# Patient Record
Sex: Female | Born: 1950
Health system: Southern US, Community
[De-identification: ages and names within clinical notes are randomized; demographics above are authoritative.]

## PROBLEM LIST (undated history)

## (undated) DIAGNOSIS — Z8639 Personal history of other endocrine, nutritional and metabolic disease: Secondary | ICD-10-CM

## (undated) DIAGNOSIS — K635 Polyp of colon: Secondary | ICD-10-CM

## (undated) DIAGNOSIS — E669 Obesity, unspecified: Secondary | ICD-10-CM

## (undated) DIAGNOSIS — F329 Major depressive disorder, single episode, unspecified: Secondary | ICD-10-CM

## (undated) DIAGNOSIS — I10 Essential (primary) hypertension: Secondary | ICD-10-CM

## (undated) DIAGNOSIS — F32A Depression, unspecified: Secondary | ICD-10-CM

## (undated) DIAGNOSIS — F419 Anxiety disorder, unspecified: Secondary | ICD-10-CM

## (undated) DIAGNOSIS — J189 Pneumonia, unspecified organism: Secondary | ICD-10-CM

## (undated) DIAGNOSIS — H269 Unspecified cataract: Secondary | ICD-10-CM

## (undated) DIAGNOSIS — E7800 Pure hypercholesterolemia, unspecified: Secondary | ICD-10-CM

## (undated) HISTORY — DX: Pneumonia, unspecified organism: J18.9

## (undated) HISTORY — DX: Unspecified cataract: H26.9

## (undated) HISTORY — PX: OTHER SURGICAL HISTORY: SHX169

## (undated) HISTORY — DX: Depression, unspecified: F32.A

## (undated) HISTORY — DX: Polyp of colon: K63.5

## (undated) HISTORY — DX: Anxiety disorder, unspecified: F41.9

## (undated) HISTORY — DX: Pure hypercholesterolemia, unspecified: E78.00

## (undated) HISTORY — DX: Personal history of other endocrine, nutritional and metabolic disease: Z86.39

## (undated) HISTORY — PX: EYE SURGERY: SHX253

## (undated) HISTORY — DX: Essential (primary) hypertension: I10

## (undated) HISTORY — DX: Major depressive disorder, single episode, unspecified: F32.9

## (undated) HISTORY — DX: Obesity, unspecified: E66.9

---

## 2001-03-13 ENCOUNTER — Emergency Department (HOSPITAL_COMMUNITY): Admission: EM | Admit: 2001-03-13 | Discharge: 2001-03-13 | Payer: Self-pay | Admitting: *Deleted

## 2001-03-13 ENCOUNTER — Encounter: Payer: Self-pay | Admitting: *Deleted

## 2001-09-19 ENCOUNTER — Encounter: Admission: RE | Admit: 2001-09-19 | Discharge: 2001-09-28 | Payer: Self-pay | Admitting: Orthopedic Surgery

## 2003-12-28 ENCOUNTER — Emergency Department (HOSPITAL_COMMUNITY): Admission: EM | Admit: 2003-12-28 | Discharge: 2003-12-28 | Payer: Self-pay | Admitting: Emergency Medicine

## 2004-01-17 ENCOUNTER — Emergency Department (HOSPITAL_COMMUNITY): Admission: EM | Admit: 2004-01-17 | Discharge: 2004-01-18 | Payer: Self-pay | Admitting: Emergency Medicine

## 2004-02-06 ENCOUNTER — Encounter: Admission: RE | Admit: 2004-02-06 | Discharge: 2004-02-06 | Payer: Self-pay | Admitting: Internal Medicine

## 2005-08-09 ENCOUNTER — Ambulatory Visit: Payer: Self-pay | Admitting: Internal Medicine

## 2005-08-10 HISTORY — PX: COLONOSCOPY: SHX174

## 2005-08-24 ENCOUNTER — Encounter (INDEPENDENT_AMBULATORY_CARE_PROVIDER_SITE_OTHER): Payer: Self-pay | Admitting: Specialist

## 2005-08-24 ENCOUNTER — Ambulatory Visit: Payer: Self-pay | Admitting: Internal Medicine

## 2006-01-10 IMAGING — CR DG CHEST 2V
2 series · 2 of 2 positions shown · non-contrast
Comparison: None.

CLINICAL DATA: Fever, chest pain, shortness of breath, and cough.
 TWO-VIEW CHEST, 12/28/03

[view not recorded (1 of 2)]
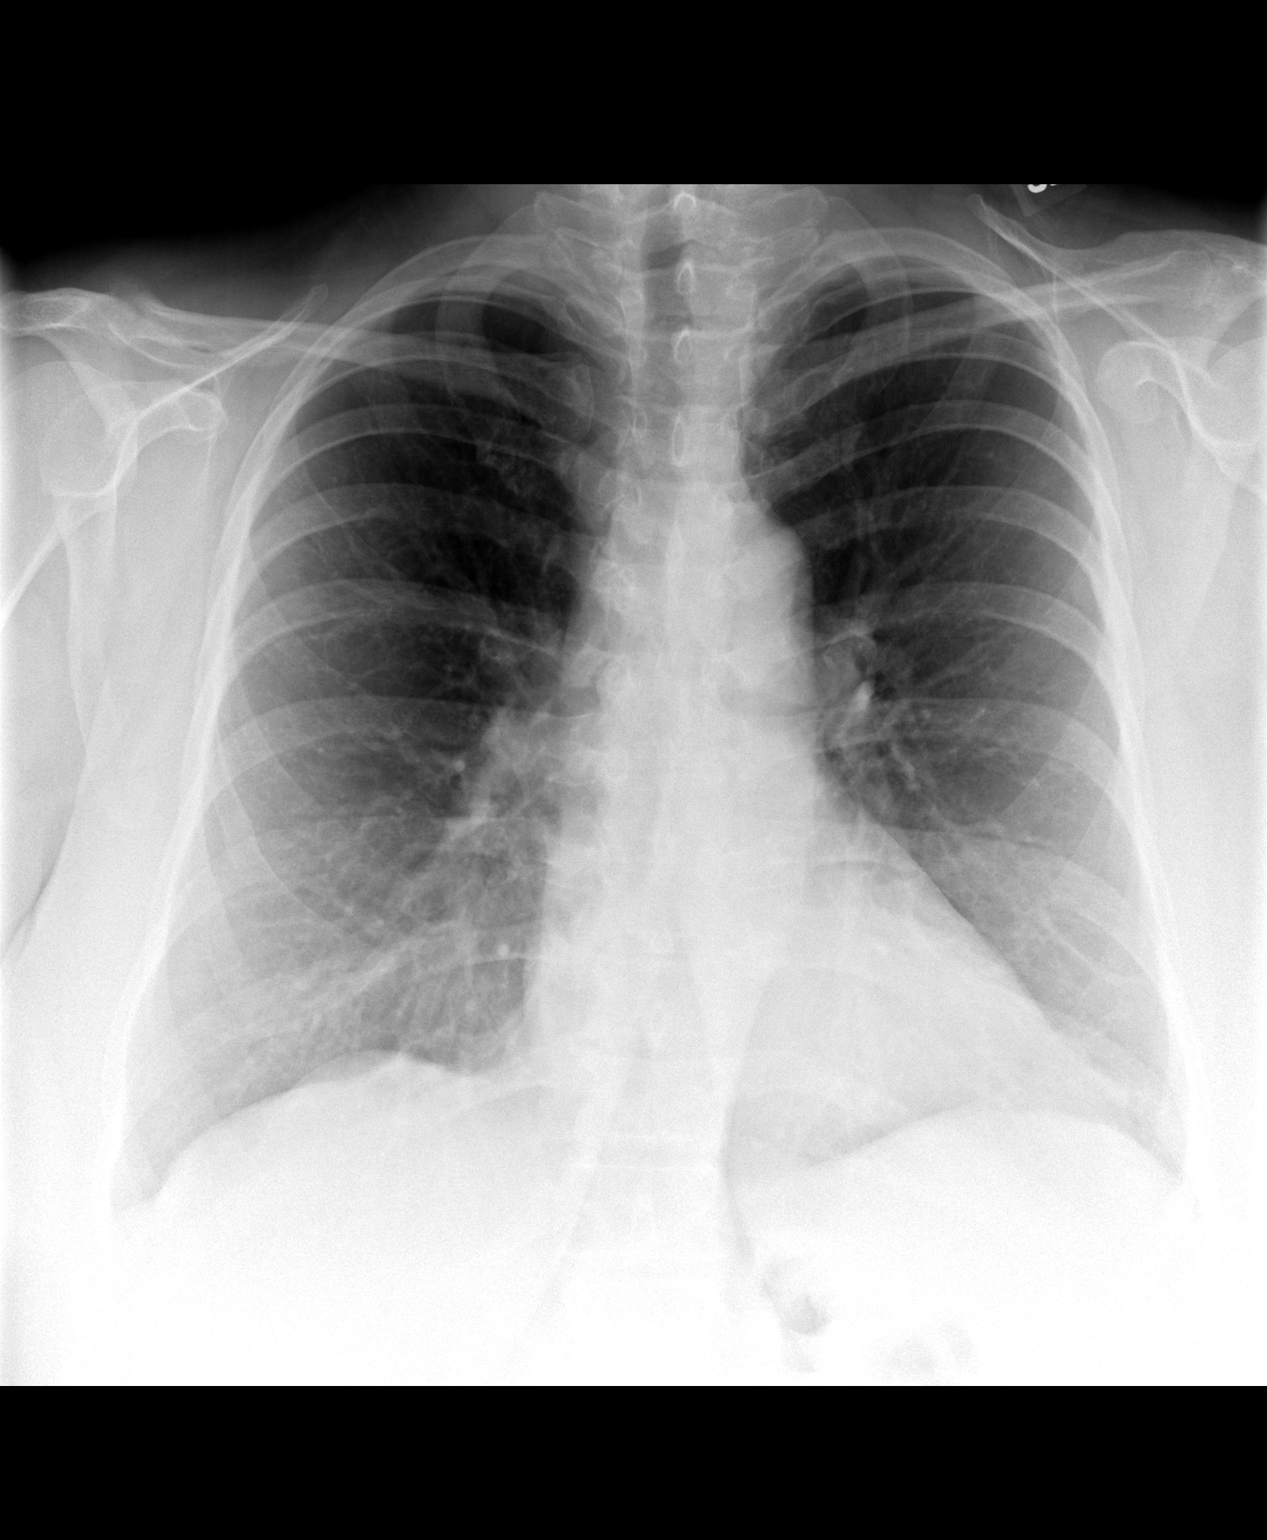

[view not recorded (2 of 2)]
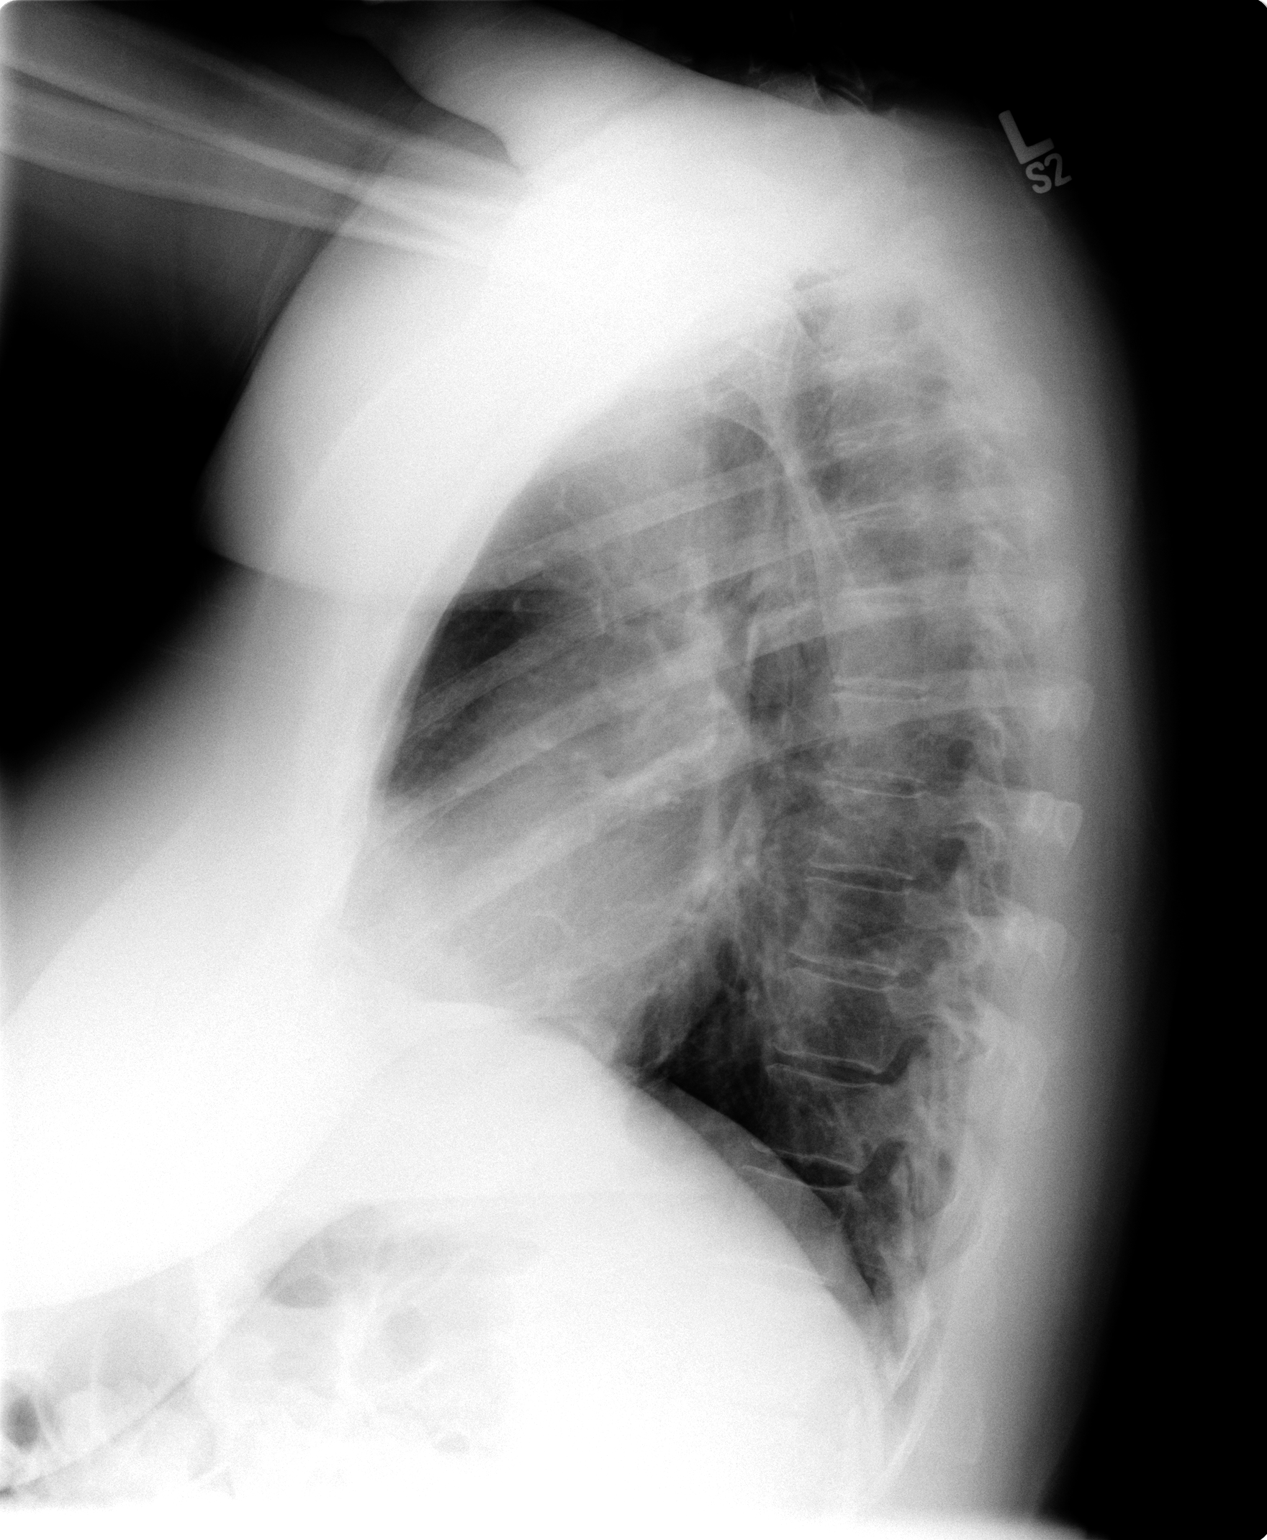

[2 of 2 positions shown; findings below may reference images not displayed]

FINDINGS: Two-view exam of the chest shows focus of atelectasis or infiltrate at the cardiac apex.  The right lung is clear.  The heart size is within normal limits.  Imaged bony structures are intact.  
 IMPRESSION 
 Atelectasis or infiltrate at the left base.  Follow-up imaging to resolution is recommended.

## 2007-01-16 ENCOUNTER — Ambulatory Visit: Payer: Self-pay | Admitting: Internal Medicine

## 2007-01-17 ENCOUNTER — Ambulatory Visit: Payer: Self-pay | Admitting: *Deleted

## 2007-01-24 ENCOUNTER — Ambulatory Visit (HOSPITAL_COMMUNITY): Admission: RE | Admit: 2007-01-24 | Discharge: 2007-01-24 | Payer: Self-pay | Admitting: Family Medicine

## 2007-02-20 ENCOUNTER — Ambulatory Visit: Payer: Self-pay | Admitting: Internal Medicine

## 2007-02-20 ENCOUNTER — Encounter: Payer: Self-pay | Admitting: Internal Medicine

## 2007-02-20 LAB — CONVERTED CEMR LAB
ALT: 21 units/L (ref 0–35)
Albumin: 4.6 g/dL (ref 3.5–5.2)
CO2: 28 meq/L (ref 19–32)
Calcium: 9.1 mg/dL (ref 8.4–10.5)
Cholesterol: 216 mg/dL — ABNORMAL HIGH (ref 0–200)
Eosinophils Relative: 2 % (ref 0–5)
HCT: 42.1 % (ref 36.0–46.0)
Hemoglobin: 13.1 g/dL (ref 12.0–15.0)
MCHC: 31.1 g/dL (ref 30.0–36.0)
MCV: 89 fL (ref 78.0–100.0)
Monocytes Relative: 7 % (ref 3–12)
Neutro Abs: 5 10*3/uL (ref 1.7–7.7)
Platelets: 230 10*3/uL (ref 150–400)
RBC: 4.73 M/uL (ref 3.87–5.11)
RDW: 13.6 % (ref 11.5–15.5)
Sodium: 145 meq/L (ref 135–145)
Total Bilirubin: 0.4 mg/dL (ref 0.3–1.2)
Total CHOL/HDL Ratio: 5.3
Total Protein: 7.4 g/dL (ref 6.0–8.3)
Triglycerides: 145 mg/dL (ref <150)
VLDL: 29 mg/dL (ref 0–40)

## 2007-02-21 ENCOUNTER — Encounter (INDEPENDENT_AMBULATORY_CARE_PROVIDER_SITE_OTHER): Payer: Self-pay | Admitting: Internal Medicine

## 2007-05-18 ENCOUNTER — Ambulatory Visit: Payer: Self-pay | Admitting: Internal Medicine

## 2008-04-30 ENCOUNTER — Ambulatory Visit: Payer: Self-pay | Admitting: Internal Medicine

## 2008-05-06 ENCOUNTER — Ambulatory Visit (HOSPITAL_COMMUNITY): Admission: RE | Admit: 2008-05-06 | Discharge: 2008-05-06 | Payer: Self-pay | Admitting: Internal Medicine

## 2008-05-27 ENCOUNTER — Emergency Department (HOSPITAL_COMMUNITY): Admission: EM | Admit: 2008-05-27 | Discharge: 2008-05-27 | Payer: Self-pay | Admitting: Emergency Medicine

## 2008-05-28 ENCOUNTER — Ambulatory Visit: Payer: Self-pay | Admitting: Family Medicine

## 2008-06-11 ENCOUNTER — Ambulatory Visit: Payer: Self-pay | Admitting: Internal Medicine

## 2008-06-13 ENCOUNTER — Ambulatory Visit: Payer: Self-pay | Admitting: Internal Medicine

## 2008-06-17 ENCOUNTER — Ambulatory Visit (HOSPITAL_COMMUNITY): Admission: RE | Admit: 2008-06-17 | Discharge: 2008-06-17 | Payer: Self-pay | Admitting: Internal Medicine

## 2008-06-25 ENCOUNTER — Ambulatory Visit: Payer: Self-pay | Admitting: Family Medicine

## 2008-07-19 ENCOUNTER — Emergency Department (HOSPITAL_COMMUNITY): Admission: EM | Admit: 2008-07-19 | Discharge: 2008-07-20 | Payer: Self-pay | Admitting: Emergency Medicine

## 2008-07-22 ENCOUNTER — Ambulatory Visit: Payer: Self-pay | Admitting: Internal Medicine

## 2008-07-22 ENCOUNTER — Encounter: Payer: Self-pay | Admitting: Internal Medicine

## 2008-07-22 DIAGNOSIS — I10 Essential (primary) hypertension: Secondary | ICD-10-CM | POA: Insufficient documentation

## 2008-07-22 DIAGNOSIS — F329 Major depressive disorder, single episode, unspecified: Secondary | ICD-10-CM | POA: Insufficient documentation

## 2008-07-22 LAB — CONVERTED CEMR LAB
BUN: 9 mg/dL (ref 6–23)
CO2: 22 meq/L (ref 19–32)
Calcium: 8.7 mg/dL (ref 8.4–10.5)
Chloride: 106 meq/L (ref 96–112)
Creatinine, Ser: 0.8 mg/dL (ref 0.40–1.20)
Glucose, Bld: 101 mg/dL — ABNORMAL HIGH (ref 70–99)
Potassium: 4.4 meq/L (ref 3.5–5.3)
Sodium: 143 meq/L (ref 135–145)

## 2008-07-25 ENCOUNTER — Emergency Department (HOSPITAL_COMMUNITY): Admission: EM | Admit: 2008-07-25 | Discharge: 2008-07-25 | Payer: Self-pay | Admitting: Emergency Medicine

## 2008-08-02 ENCOUNTER — Emergency Department (HOSPITAL_COMMUNITY): Admission: EM | Admit: 2008-08-02 | Discharge: 2008-08-02 | Payer: Self-pay | Admitting: Family Medicine

## 2008-08-15 ENCOUNTER — Ambulatory Visit: Payer: Self-pay | Admitting: Internal Medicine

## 2008-10-19 ENCOUNTER — Emergency Department (HOSPITAL_COMMUNITY): Admission: EM | Admit: 2008-10-19 | Discharge: 2008-10-19 | Payer: Self-pay | Admitting: Emergency Medicine

## 2008-11-08 ENCOUNTER — Ambulatory Visit: Payer: Self-pay | Admitting: Internal Medicine

## 2008-12-24 ENCOUNTER — Ambulatory Visit: Payer: Self-pay | Admitting: Internal Medicine

## 2009-01-07 ENCOUNTER — Ambulatory Visit: Payer: Self-pay | Admitting: Internal Medicine

## 2009-02-11 ENCOUNTER — Telehealth (INDEPENDENT_AMBULATORY_CARE_PROVIDER_SITE_OTHER): Payer: Self-pay | Admitting: *Deleted

## 2009-02-12 ENCOUNTER — Encounter (INDEPENDENT_AMBULATORY_CARE_PROVIDER_SITE_OTHER): Payer: Self-pay | Admitting: Adult Health

## 2009-02-12 ENCOUNTER — Ambulatory Visit: Payer: Self-pay | Admitting: Internal Medicine

## 2009-02-15 ENCOUNTER — Emergency Department (HOSPITAL_COMMUNITY): Admission: EM | Admit: 2009-02-15 | Discharge: 2009-02-15 | Payer: Self-pay | Admitting: Emergency Medicine

## 2009-05-02 ENCOUNTER — Telehealth (INDEPENDENT_AMBULATORY_CARE_PROVIDER_SITE_OTHER): Payer: Self-pay | Admitting: *Deleted

## 2009-07-08 ENCOUNTER — Ambulatory Visit: Payer: Self-pay | Admitting: Internal Medicine

## 2009-07-28 ENCOUNTER — Ambulatory Visit (HOSPITAL_COMMUNITY): Admission: RE | Admit: 2009-07-28 | Discharge: 2009-07-28 | Payer: Self-pay | Admitting: Internal Medicine

## 2009-08-28 ENCOUNTER — Telehealth (INDEPENDENT_AMBULATORY_CARE_PROVIDER_SITE_OTHER): Payer: Self-pay | Admitting: *Deleted

## 2009-08-30 ENCOUNTER — Emergency Department (HOSPITAL_COMMUNITY): Admission: EM | Admit: 2009-08-30 | Discharge: 2009-08-30 | Payer: Self-pay | Admitting: Family Medicine

## 2009-10-01 ENCOUNTER — Ambulatory Visit: Payer: Self-pay | Admitting: Internal Medicine

## 2009-10-01 LAB — CONVERTED CEMR LAB
Calcium: 9.2 mg/dL (ref 8.4–10.5)
Glucose, Bld: 91 mg/dL (ref 70–99)
LDL Cholesterol: 118 mg/dL — ABNORMAL HIGH (ref 0–99)
Potassium: 4.8 meq/L (ref 3.5–5.3)
Sodium: 143 meq/L (ref 135–145)
Total CHOL/HDL Ratio: 5.3
VLDL: 31 mg/dL (ref 0–40)

## 2009-10-16 ENCOUNTER — Ambulatory Visit (HOSPITAL_COMMUNITY): Admission: RE | Admit: 2009-10-16 | Discharge: 2009-10-16 | Payer: Self-pay | Admitting: Internal Medicine

## 2009-10-16 ENCOUNTER — Ambulatory Visit: Payer: Self-pay | Admitting: Family Medicine

## 2009-12-04 ENCOUNTER — Ambulatory Visit: Payer: Self-pay | Admitting: Internal Medicine

## 2010-02-16 ENCOUNTER — Ambulatory Visit: Payer: Self-pay | Admitting: Internal Medicine

## 2010-05-12 NOTE — Progress Notes (Signed)
Summary: triage/possible flu  Phone Note Call from Patient   Caller: Patient Reason for Call: Talk to Nurse Summary of Call: Patient called stating she has a fever of 100.7.Marland KitchenMarland KitchenMarland KitchenIt started with a headache on Tuesday and then a cough on Wednesday.Marland KitchenMarland KitchenShe describes her cough as a dry cough.Marland KitchenMarland KitchenShe is concerned because she has had pneumonia three times  in the past with the last time being 2 years ago.Marland KitchenMarland KitchenPeople at her work have been sick with the same s/s.Marland KitchenMarland KitchenMarland KitchenShe feels tired and weak but denies sore throat, difficulty breathing, chest tightness or chest pain.Marland KitchenMarland KitchenMarland KitchenShe has a hx of HTN...and will get OTC med from pharmacy for cough - and will ask pharmacist what is okay with her htn... Advise patient to stay home from work if possible.Marland KitchenMarland KitchenIt sounds like she has a virus possibly the flu...and should drink plenty of liquids and get rest..Marland KitchenShe will call me Monday morning to let me know how she is doing.Marland KitchenMarland KitchenShe is aware she can go to ED this weekend if s/s become severe..  Initial call taken by: Conchita Paris,  May 02, 2009 10:30 AM

## 2010-05-12 NOTE — Progress Notes (Signed)
Summary: triage/head congestion  Phone Note Call from Patient   Caller: Patient Reason for Call: Talk to Nurse Summary of Call: patient states she has had a head cold for one week with a dry cough. She states she does not have a fever and is calling to see what she can get OTC...she has a history of pneumonia but feels the congestion is just in her head...she denies any hx of asthma or chronic health conditions. Advised patient to try OTC mucinex...she can take a list of her medications with her to pharmacy to see if anything else or any Mucinex-D would be appropriate..speak with pharmacists. Patient states understanding and will call back if no improvement or worsening in s/s.  We also discussed weight control..since patient broke up that a few days ago her knee was hurting...seems okay now...she is 5'3" and weighs 200 lbs...we discussed portion control, fresh fruits and vegetables.Marland Kitchenat least 64oz of water daily...walking exercising...checking out [employer].Marland Kitchen scholarships.Marland KitchenMarland KitchenPatient states understanding. Initial call taken by: Conchita Paris,  Aug 28, 2009 11:33 AM

## 2010-07-15 LAB — URINE MICROSCOPIC-ADD ON

## 2010-07-15 LAB — URINE CULTURE: Colony Count: 35000

## 2010-07-15 LAB — URINALYSIS, ROUTINE W REFLEX MICROSCOPIC
Glucose, UA: NEGATIVE mg/dL
Protein, ur: NEGATIVE mg/dL
Specific Gravity, Urine: 1.011 (ref 1.005–1.030)

## 2010-07-19 LAB — URINALYSIS, ROUTINE W REFLEX MICROSCOPIC
Bilirubin Urine: NEGATIVE
Hgb urine dipstick: NEGATIVE
Nitrite: NEGATIVE
Protein, ur: NEGATIVE mg/dL
pH: 7 (ref 5.0–8.0)

## 2010-07-19 LAB — POCT I-STAT, CHEM 8
BUN: 8 mg/dL (ref 6–23)
Chloride: 106 mEq/L (ref 96–112)
HCT: 35 % — ABNORMAL LOW (ref 36.0–46.0)
Potassium: 3.8 mEq/L (ref 3.5–5.1)
TCO2: 26 mmol/L (ref 0–100)

## 2010-07-19 LAB — POCT CARDIAC MARKERS
CKMB, poc: <1 ng/mL — ABNORMAL LOW (ref 1.0–8.0)
Myoglobin, poc: 81.1 ng/mL (ref 12–200)
Troponin i, poc: <0.05 ng/mL (ref 0.00–0.09)

## 2010-07-22 LAB — URINALYSIS, ROUTINE W REFLEX MICROSCOPIC
Bilirubin Urine: NEGATIVE
Glucose, UA: NEGATIVE mg/dL
Hgb urine dipstick: NEGATIVE
Hgb urine dipstick: NEGATIVE
Ketones, ur: NEGATIVE mg/dL
Nitrite: NEGATIVE
Protein, ur: NEGATIVE mg/dL
Specific Gravity, Urine: 1.005 (ref 1.005–1.030)
Urobilinogen, UA: 0.2 mg/dL (ref 0.0–1.0)
Urobilinogen, UA: 0.2 mg/dL (ref 0.0–1.0)
pH: 7 (ref 5.0–8.0)

## 2010-07-22 LAB — COMPREHENSIVE METABOLIC PANEL
AST: 29 U/L (ref 0–37)
Alkaline Phosphatase: 87 U/L (ref 39–117)
BUN: 9 mg/dL (ref 6–23)
CO2: 26 mEq/L (ref 19–32)
CO2: 29 mEq/L (ref 19–32)
Calcium: 8.8 mg/dL (ref 8.4–10.5)
Creatinine, Ser: 0.77 mg/dL (ref 0.4–1.2)
Creatinine, Ser: 0.8 mg/dL (ref 0.4–1.2)
GFR calc Af Amer: >60 mL/min (ref >60)
GFR calc non Af Amer: >60 mL/min (ref >60)
Glucose, Bld: 103 mg/dL — ABNORMAL HIGH (ref 70–99)
Glucose, Bld: 105 mg/dL — ABNORMAL HIGH (ref 70–99)
Sodium: 128 mEq/L — ABNORMAL LOW (ref 135–145)
Sodium: 141 mEq/L (ref 135–145)
Total Bilirubin: 0.6 mg/dL (ref 0.3–1.2)
Total Protein: 6.8 g/dL (ref 6.0–8.3)

## 2010-07-22 LAB — URINE MICROSCOPIC-ADD ON

## 2010-07-22 LAB — DIFFERENTIAL
Basophils Absolute: 0.1 10*3/uL (ref 0.0–0.1)
Eosinophils Absolute: 0.1 10*3/uL (ref 0.0–0.7)
Eosinophils Relative: 1 % (ref 0–5)
Eosinophils Relative: 1 % (ref 0–5)
Lymphocytes Relative: 27 % (ref 12–46)
Monocytes Absolute: 0.3 10*3/uL (ref 0.1–1.0)
Monocytes Absolute: 0.6 10*3/uL (ref 0.1–1.0)
Neutro Abs: 5.8 10*3/uL (ref 1.7–7.7)
Neutro Abs: 6.3 10*3/uL (ref 1.7–7.7)
Neutrophils Relative %: 64 % (ref 43–77)
Smear Review: ADEQUATE

## 2010-07-22 LAB — CBC
MCV: 86.1 fL (ref 78.0–100.0)
MCV: 86.6 fL (ref 78.0–100.0)
RBC: 4.12 MIL/uL (ref 3.87–5.11)
RBC: 4.38 MIL/uL (ref 3.87–5.11)

## 2010-07-28 LAB — CK TOTAL AND CKMB (NOT AT ARMC)
Relative Index: INVALID (ref 0.0–2.5)
Total CK: 91 U/L (ref 7–177)

## 2010-07-28 LAB — CBC
MCHC: 34.5 g/dL (ref 30.0–36.0)
Platelets: 206 10*3/uL (ref 150–400)
RBC: 4.74 MIL/uL (ref 3.87–5.11)
RDW: 14.2 % (ref 11.5–15.5)

## 2010-07-28 LAB — BASIC METABOLIC PANEL
BUN: 10 mg/dL (ref 6–23)
CO2: 26 mEq/L (ref 19–32)
Calcium: 9.2 mg/dL (ref 8.4–10.5)
Creatinine, Ser: 0.82 mg/dL (ref 0.4–1.2)
GFR calc Af Amer: >60 mL/min (ref >60)
Glucose, Bld: 102 mg/dL — ABNORMAL HIGH (ref 70–99)

## 2010-08-26 ENCOUNTER — Other Ambulatory Visit (HOSPITAL_COMMUNITY): Payer: Self-pay | Admitting: Family Medicine

## 2010-08-26 ENCOUNTER — Other Ambulatory Visit: Payer: Self-pay | Admitting: Family Medicine

## 2010-08-26 DIAGNOSIS — Z1231 Encounter for screening mammogram for malignant neoplasm of breast: Secondary | ICD-10-CM

## 2010-09-09 ENCOUNTER — Ambulatory Visit (HOSPITAL_COMMUNITY)
Admission: RE | Admit: 2010-09-09 | Discharge: 2010-09-09 | Disposition: A | Discharge status: Home / Self Care | Payer: Self-pay | Source: Ambulatory Visit | Attending: Family Medicine | Admitting: Family Medicine

## 2010-09-09 DIAGNOSIS — Z1231 Encounter for screening mammogram for malignant neoplasm of breast: Secondary | ICD-10-CM | POA: Insufficient documentation

## 2010-09-29 ENCOUNTER — Encounter: Payer: Self-pay | Admitting: Internal Medicine

## 2012-01-06 ENCOUNTER — Encounter: Payer: Self-pay | Admitting: Internal Medicine

## 2012-05-15 ENCOUNTER — Emergency Department (HOSPITAL_COMMUNITY)
Admission: EM | Admit: 2012-05-15 | Discharge: 2012-05-15 | Disposition: A | Discharge status: Home / Self Care | Payer: No Typology Code available for payment source | Source: Home / Self Care | Attending: Family Medicine | Admitting: Family Medicine

## 2012-05-15 ENCOUNTER — Encounter (HOSPITAL_COMMUNITY): Payer: Self-pay

## 2012-05-15 DIAGNOSIS — R7303 Prediabetes: Secondary | ICD-10-CM

## 2012-05-15 DIAGNOSIS — R7309 Other abnormal glucose: Secondary | ICD-10-CM

## 2012-05-15 DIAGNOSIS — F329 Major depressive disorder, single episode, unspecified: Secondary | ICD-10-CM

## 2012-05-15 DIAGNOSIS — I10 Essential (primary) hypertension: Secondary | ICD-10-CM

## 2012-05-15 LAB — COMPREHENSIVE METABOLIC PANEL
BUN: 9 mg/dL (ref 6–23)
Calcium: 9.4 mg/dL (ref 8.4–10.5)
Creatinine, Ser: 0.78 mg/dL (ref 0.50–1.10)
GFR calc Af Amer: >90 mL/min (ref >90)
Glucose, Bld: 102 mg/dL — ABNORMAL HIGH (ref 70–99)
Total Protein: 7.2 g/dL (ref 6.0–8.3)

## 2012-05-15 LAB — LIPID PANEL
Cholesterol: 239 mg/dL — ABNORMAL HIGH (ref 0–200)
HDL: 45 mg/dL (ref >39)
Total CHOL/HDL Ratio: 5.3 RATIO

## 2012-05-15 MED ORDER — LOSARTAN POTASSIUM 50 MG PO TABS: 50.0000 mg | ORAL_TABLET | Freq: Every day | ORAL | Status: DC

## 2012-05-15 MED ORDER — CITALOPRAM HYDROBROMIDE 20 MG PO TABS: 20.0000 mg | ORAL_TABLET | Freq: Every day | ORAL | Status: DC

## 2012-05-15 NOTE — ED Notes (Signed)
Patient states needs medication refill on bp med.  Also has some paper work from [employer] center she needs filled out

## 2012-05-15 NOTE — ED Provider Notes (Signed)
History     CSN: [id number]  Arrival date & time 05/15/12  1558   First MD Initiated Contact with Patient 05/15/12 1733      Chief Complaint  Patient presents with  . Medication Refill    (Consider location/radiation/quality/duration/timing/severity/associated sxs/prior treatment) HPI Patient presenting to get refills on her medications.  She reports that she has only been taking one half of her blood pressure pill.  She only takes one half of the 100 mg tablet of losartan.  The patient reports that she has had good blood pressure control with this regimen.  She's been doing this for several months.  Her blood pressure is well-controlled today. 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  She has no job.  She reports that her husband lost his job as well.  She otherwise reports that she has no complaints.  She is currently working hard to try and find a job.  History reviewed. No pertinent past medical history.  History reviewed. No pertinent past surgical history.  No family history on file.  History  Substance Use Topics  . Smoking status: Not on file  . Smokeless tobacco: Not on file  . Alcohol Use: Not on file    OB History    Grav Para Term Preterm Abortions TAB SAB Ect Mult Living                 Review of Systems  Constitutional: Negative.   HENT: Positive for congestion.   Eyes: Negative.   Respiratory: Negative.   Cardiovascular: Negative.   Gastrointestinal: Negative.   Musculoskeletal: Negative.   Neurological: Negative.   Hematological: Negative.   Psychiatric/Behavioral: Negative.     Allergies  Belladonna; Phenobarbital; and Quinine  Home Medications   Current Outpatient Rx  Name  Route  Sig  Dispense  Refill  . CITALOPRAM HYDROBROMIDE 20 MG PO TABS   Oral   Take 20 mg by mouth daily. Take one and one half daily         . LOSARTAN POTASSIUM 100 MG PO TABS   Oral   Take 100 mg by mouth daily.           BP 129/53  Pulse  69  Temp 97.9 F (36.6 C) (Oral)  Resp 16  SpO2 99%  Physical Exam  Nursing note and vitals reviewed. Constitutional: She is oriented to person, place, and time. She appears well-developed and well-nourished. No distress.  HENT:  Head: Normocephalic and atraumatic.  Eyes: Conjunctivae normal and EOM are normal. Pupils are equal, round, and reactive to light.  Neck: Normal range of motion. Neck supple. No JVD present. No tracheal deviation present. No thyromegaly present.  Cardiovascular: Normal rate, regular rhythm and normal heart sounds.   Pulmonary/Chest: Effort normal and breath sounds normal.  Abdominal: Soft. Bowel sounds are normal. She exhibits no distension and no mass. There is no tenderness. There is no rebound and no guarding.  Musculoskeletal: Normal range of motion. She exhibits no edema and no tenderness.  Lymphadenopathy:    She has no cervical adenopathy.  Neurological: She is alert and oriented to person, place, and time.  Skin: Skin is warm and dry. No erythema.  Psychiatric: She has a normal mood and affect. Her behavior is normal. Judgment and thought content normal.    ED Course  Procedures (including critical care time)  Labs Reviewed - No data to display No results found.   No diagnosis found.  MDM  IMPRESSION  Hypertension  Prediabetes   RECOMMENDATIONS / PLAN Check labs today Refill medications  FOLLOW UP 3 months  The patient was given clear instructions to go to ER or return to medical center if symptoms don't improve, worsen or new problems develop.  The patient verbalized understanding.  The patient was told to call to get lab results if they haven't heard anything in the next week.            Cleora Fleet, MD 05/15/12 2006

## 2012-05-16 LAB — HEMOGLOBIN A1C: Mean Plasma Glucose: 126 mg/dL — ABNORMAL HIGH (ref <117)

## 2012-05-17 NOTE — Progress Notes (Signed)
Quick Note:  Please notify patient that her blood sugar and A1c is rising. Her A1c is 6.0 now and it was 5.8% 2 years ago. She has prediabetes. Please send her some information on diabetes diet and exercise. Her cholesterol levels are very high. Recommend that she start a low fat low cholesterol diet. She should have her labs rechecked in 3 months.   Rodney Langton, MD, CDE, FAAFP [facility] [place], [place]   ______

## 2012-05-18 ENCOUNTER — Telehealth (HOSPITAL_COMMUNITY): Payer: Self-pay

## 2012-05-18 NOTE — Telephone Encounter (Signed)
Message copied by Lestine Mount on Thu May 18, 2012  9:16 AM ------      Message from: Cleora Fleet      Created: Wed May 17, 2012  8:58 AM       Please notify patient that her blood sugar and A1c is rising.  Her A1c is 6.0 now and it was 5.8% 2 years ago.  She has prediabetes.  Please send her some information on diabetes diet and exercise.  Her cholesterol levels are very high.  Recommend that she start a low fat low cholesterol diet.  She should have her labs rechecked in 3 months.               Rodney Langton, MD, CDE, FAAFP      [facility]      [facility]      [place], [place]

## 2012-09-19 ENCOUNTER — Ambulatory Visit: Payer: No Typology Code available for payment source | Attending: Family Medicine | Admitting: Family Medicine

## 2012-09-19 VITALS — BP 146/42 | HR 69 | Temp 98.2°F | Resp 16 | Ht 62.99 in | Wt 210.0 lb

## 2012-09-19 DIAGNOSIS — E785 Hyperlipidemia, unspecified: Secondary | ICD-10-CM

## 2012-09-19 DIAGNOSIS — R0789 Other chest pain: Secondary | ICD-10-CM | POA: Insufficient documentation

## 2012-09-19 DIAGNOSIS — I10 Essential (primary) hypertension: Secondary | ICD-10-CM

## 2012-09-19 DIAGNOSIS — F329 Major depressive disorder, single episode, unspecified: Secondary | ICD-10-CM

## 2012-09-19 LAB — LIPID PANEL
Cholesterol: 185 mg/dL (ref 0–200)
Total CHOL/HDL Ratio: 4.9 Ratio

## 2012-09-19 LAB — COMPREHENSIVE METABOLIC PANEL
AST: 24 U/L (ref 0–37)
Albumin: 4 g/dL (ref 3.5–5.2)
BUN: 12 mg/dL (ref 6–23)
Calcium: 8.7 mg/dL (ref 8.4–10.5)
Chloride: 105 mEq/L (ref 96–112)
Glucose, Bld: 95 mg/dL (ref 70–99)
Potassium: 4.4 mEq/L (ref 3.5–5.3)
Sodium: 138 mEq/L (ref 135–145)
Total Protein: 6.2 g/dL (ref 6.0–8.3)

## 2012-09-19 LAB — CBC
Hemoglobin: 12.9 g/dL (ref 12.0–15.0)
MCHC: 33.1 g/dL (ref 30.0–36.0)
Platelets: 231 10*3/uL (ref 150–400)

## 2012-09-19 LAB — POCT GLYCOSYLATED HEMOGLOBIN (HGB A1C): Hemoglobin A1C: 5.6

## 2012-09-19 MED ORDER — LOSARTAN POTASSIUM-HCTZ 50-12.5 MG PO TABS: 1.0000 | ORAL_TABLET | Freq: Every day | ORAL | Status: DC

## 2012-09-19 NOTE — Progress Notes (Addendum)
Patient ID: Angelica Harvey, female   DOB: Mar 28, 1951, 62 y.o.   MRN: [id number]  CC: follow up   HPI: Pt says that she has had twinges of palpatations and chest discomfort on occasional.  Pt says that she is taking her medication.  Pt says that she is working to lower her carb intake.  Otherwise pt says that she is well.    Allergies  Allergen Reactions  . Belladonna     REACTION: rash  . Phenobarbital     REACTION: rash  . Quinine     REACTION: rash   No past medical history on file. Current Outpatient Prescriptions on File Prior to Visit  Medication Sig Dispense Refill  . citalopram (CELEXA) 20 MG tablet Take 1 tablet (20 mg total) by mouth daily. Take one and one half daily  30 tablet  3  . losartan (COZAAR) 50 MG tablet Take 1 tablet (50 mg total) by mouth daily.  30 tablet  3   No current facility-administered medications on file prior to visit.   No family history on file. History   Social History  . Marital Status: Married    Spouse Name: N/A    Number of Children: N/A  . Years of Education: N/A   Occupational History  . Not on file.   Social History Main Topics  . Smoking status: Never Smoker   . Smokeless tobacco: Not on file  . Alcohol Use: Not on file  . Drug Use: Not on file  . Sexually Active: Not on file   Other Topics Concern  . Not on file   Social History Narrative  . No narrative on file    Review of Systems  Constitutional: Negative for fever, chills, diaphoresis, activity change, appetite change and fatigue.  HENT: Negative for ear pain, nosebleeds, congestion, facial swelling, rhinorrhea, neck pain, neck stiffness and ear discharge.   Eyes: Negative for pain, discharge, redness, itching and visual disturbance.  Respiratory: Negative for cough, choking, chest tightness, shortness of breath, wheezing and stridor.   Cardiovascular: Negative for chest pain, palpitations and leg swelling.  Gastrointestinal: Negative for abdominal distention.   Genitourinary: Negative for dysuria, urgency, frequency, hematuria, flank pain, decreased urine volume, difficulty urinating and dyspareunia.  Musculoskeletal: Negative for back pain, joint swelling, arthralgias and gait problem.  Neurological: Negative for dizziness, tremors, seizures, syncope, facial asymmetry, speech difficulty, weakness, light-headedness, numbness and headaches.  Hematological: Negative for adenopathy. Does not bruise/bleed easily.  Psychiatric/Behavioral: Negative for hallucinations, behavioral problems, confusion, dysphoric mood, decreased concentration and agitation.    Objective:   Filed Vitals:   09/19/12 1530  BP: 146/42  Pulse: 69  Temp: 98.2 F (36.8 C)  Resp: 16    Physical Exam  Constitutional: Appears well-developed and well-nourished. No distress.  HENT: Normocephalic. External right and left ear normal. Oropharynx is clear and moist.  Eyes: Conjunctivae and EOM are normal. PERRLA, no scleral icterus.  Neck: Normal ROM. Neck supple. No JVD. No tracheal deviation. No thyromegaly.  CVS: RRR, S1/S2 +, no murmurs, no gallops, no carotid bruit.  Pulmonary: Effort and breath sounds normal, no stridor, rhonchi, wheezes, rales.  Abdominal: Soft. BS +,  no distension, tenderness, rebound or guarding.  Musculoskeletal: Normal range of motion. No edema and no tenderness.  Lymphadenopathy: No lymphadenopathy noted, cervical, inguinal. Neuro: Alert. Normal reflexes, muscle tone coordination. No cranial nerve deficit. Skin: Skin is warm and dry. No rash noted. Not diaphoretic. No erythema. No pallor.  Psychiatric: Normal mood and  affect. Behavior, judgment, thought content normal.   Lab Results  Component Value Date   WBC 8.0 07/25/2008   HGB 11.9* 10/19/2008   HCT 35.0* 10/19/2008   MCV 86.1 07/25/2008   PLT 213 07/25/2008   Lab Results  Component Value Date   CREATININE 0.78 05/15/2012   BUN 9 05/15/2012   NA 141 05/15/2012   K 4.2 05/15/2012   CL 105 05/15/2012    CO2 28 05/15/2012    Lab Results  Component Value Date   HGBA1C 6.0* 05/15/2012   Lipid Panel     Component Value Date/Time   CHOL 239* 05/15/2012 1755   TRIG 176* 05/15/2012 1755   HDL 45 05/15/2012 1755   CHOLHDL 5.3 05/15/2012 1755   VLDL 35 05/15/2012 1755   LDLCALC 159* 05/15/2012 1755       Assessment and plan:   Patient Active Problem List   Diagnosis Date Noted  . Dyslipidemia 09/19/2012  . DEPRESSION 07/22/2008  . HYPERTENSION 07/22/2008  . HYPOKALEMIA, HX OF 07/22/2008     Add HCT to the losartan 50/12.5 take 1 po daily Check labs today. Check EKG Gave information to patient about diet and exercise Referral to cardiology for evalu of chest discomfort Pt is up to date on colonoscopy.  Follow up in 3 months  The patient was given clear instructions to go to ER or return to medical center if symptoms don't improve, worsen or new problems develop.  The patient verbalized understanding.  The patient was told to call to get lab results if they haven't heard anything in the next week.    Rodney Langton, MD, CDE, FAAFP [facility] [place], [place]

## 2012-09-19 NOTE — Patient Instructions (Addendum)

## 2012-09-19 NOTE — Progress Notes (Signed)
Pt is here for a 3 month f/u from 02/14 for prediabetes and needing refills on meds Reports she does not need Celexa since [facility] is Rx this med for her She is alert and oriented w/no signs of acute distress.

## 2012-09-19 NOTE — Addendum Note (Signed)
Addended by: Cleora Fleet on: 09/19/2012 04:13 PM   Modules accepted: Orders

## 2012-09-20 ENCOUNTER — Telehealth: Payer: Self-pay

## 2012-09-20 NOTE — Telephone Encounter (Signed)
Patient called stating the losartan-HCTZ she can not take The hydrachlorathiazide she states she cant take because it messes her up She also stated she is one of the 30% who can not take this

## 2012-09-20 NOTE — Progress Notes (Signed)
Quick Note:  Please inform patient that labs came back okay.  C. L. Johnson, MD, CDE, FAAFP [facility] [employer] [place], [place]   ______ 

## 2012-09-21 ENCOUNTER — Telehealth: Payer: Self-pay | Admitting: *Deleted

## 2012-09-21 MED ORDER — LOSARTAN POTASSIUM 50 MG PO TABS: 50.0000 mg | ORAL_TABLET | Freq: Every day | ORAL | Status: DC

## 2012-09-21 NOTE — Telephone Encounter (Signed)
Patient called to the office reporting that she does not tolerate hydrochlorothiazide.  Therefore, I discontinued the Hyzaar and will give patient a new prescription for losartan 50 mg take 1 by mouth daily, #30, refill x5.  Rodney Langton, MD, CDE, FAAFP [facility] [place], [place]

## 2012-09-21 NOTE — Telephone Encounter (Signed)
09/21/12 Patient made aware of lab results WNL per Dr. Laural Benes. Also patient she was in clinic yesterday and received script from Dr.Johnson lasartan/hydrochorothiazide patient stated she can not take  hydrochorothiazide please inform Dr. Laural Benes prefer taking what she currently is taking [facility]

## 2012-09-21 NOTE — Telephone Encounter (Signed)
Please have the patient come to pick up a new prescription for losartan 50 mg take 1 po daily, #30, RFx5.  This one has no hydrochlorothiazide.     Rodney Langton, MD, CDE, FAAFP [facility] [place], [place]

## 2012-09-21 NOTE — Telephone Encounter (Signed)
09/21/12 Message left for patient to pick up script at desk. P.Nicolai Labonte,RN

## 2012-10-05 ENCOUNTER — Telehealth: Payer: Self-pay | Admitting: *Deleted

## 2012-11-17 ENCOUNTER — Institutional Professional Consult (permissible substitution): Payer: Self-pay | Admitting: Cardiology

## 2013-01-11 ENCOUNTER — Ambulatory Visit (INDEPENDENT_AMBULATORY_CARE_PROVIDER_SITE_OTHER): Payer: No Typology Code available for payment source | Admitting: Cardiology

## 2013-01-11 ENCOUNTER — Encounter: Payer: Self-pay | Admitting: Cardiology

## 2013-01-11 VITALS — BP 138/70 | HR 64 | Ht 63.0 in | Wt 215.2 lb

## 2013-01-11 DIAGNOSIS — E785 Hyperlipidemia, unspecified: Secondary | ICD-10-CM

## 2013-01-11 DIAGNOSIS — R079 Chest pain, unspecified: Secondary | ICD-10-CM

## 2013-01-11 DIAGNOSIS — I10 Essential (primary) hypertension: Secondary | ICD-10-CM

## 2013-01-11 NOTE — Patient Instructions (Addendum)
Take Aciphex daily for at least 2 weeks and see if your symptoms improve.  If you continue to have chest pain let me know and we will schedule you for a stress test.

## 2013-01-11 NOTE — Progress Notes (Signed)
Angelica Harvey Date of Birth: 18-Oct-1950 Medical Record #[id number]  History of Present Illness: Angelica Harvey is seen at the request of Dr. Laural Benes for evaluation of chest pain. She is very pleasant 62 year old white female. She has a history of hypertension and hypercholesterolemia. She has a history of obesity. She reports she has been under a lot of stress this year since she lost her job. She states that early in the summer she was experiencing symptoms of chest discomfort sometimes in her left anterior chest and sometimes in the right. She is convinced now that this is more acid reflux. She describes her pain as sharp. She has associated soreness in her throat especially when she is lying down. She has a lot of indigestion symptoms. She has a dry cough. She has no prior history of heart disease. Her symptoms are not exertional.  Current Outpatient Prescriptions on File Prior to Visit  Medication Sig Dispense Refill  . losartan (COZAAR) 50 MG tablet Take 1 tablet (50 mg total) by mouth daily.  30 tablet  5   No current facility-administered medications on file prior to visit.    Allergies  Allergen Reactions  . Belladonna     REACTION: rash  . Hctz [Hydrochlorothiazide]   . Phenobarbital     REACTION: rash  . Quinine     REACTION: rash    Past Medical History  Diagnosis Date  . Dyslipidemia   . Depression   . Hypertension   . History of hypokalemia   . Hypercholesterolemia     Past Surgical History  Procedure Laterality Date  . Cesarean section      History  Smoking status  . Former Smoker -- 1.00 packs/day for 17 years  . Quit date: 04/12/1992  Smokeless tobacco  . Not on file    History  Alcohol Use: Not on file    Family History  Problem Relation Age of Onset  . Heart attack Father   . Heart disease Father     CABG    Review of Systems: As noted in history of present illness.  All other systems were reviewed and are negative.  Physical Exam: BP  138/70  Pulse 64  Ht 5\' 3"  (1.6 m)  Wt 215 lb 4 oz (97.637 kg)  BMI 38.14 kg/m2 She is a pleasant, morbidly obese we female in no acute distress. HEENT: Normal. Neck: No adenopathy, JVD, bruits, or thyromegaly. Lungs: Clear Cardiovascular: Regular rate and rhythm. Normal S1 and S2 without gallop, murmur, or click. Abdomen: Obese, soft, nontender. Bowel sounds are positive. No masses or bruits. Extremities: Femoral and pedal pulses are 2+. She has no edema. Skin: Warm and dry Neuro: Alert oriented x3. Cranial nerves II through XII are intact.  LABORATORY DATA: Lab Results  Component Value Date   WBC 10.1 09/19/2012   HGB 12.9 09/19/2012   HCT 39.0 09/19/2012   PLT 231 09/19/2012   GLUCOSE 95 09/19/2012   CHOL 185 09/19/2012   TRIG 143 09/19/2012   HDL 38* 09/19/2012   LDLCALC 118* 09/19/2012   ALT 25 09/19/2012   AST 24 09/19/2012   NA 138 09/19/2012   K 4.4 09/19/2012   CL 105 09/19/2012   CREATININE 0.82 09/19/2012   BUN 12 09/19/2012   CO2 26 09/19/2012   TSH 1.655 05/15/2012   HGBA1C 5.6 % 09/19/2012    ECG on 09/19/2012 was reviewed. This is a normal study.  Assessment / Plan: 1. Atypical chest pain. Clinically  her symptoms are much more consistent with acid reflux disease. I suspect her dry cough may also be related to reflux. Other potential etiologies include coronary disease or gallstone disease. She does have cardiac factors of hypertension, hypercholesterolemia, and family history of heart disease.  2. Hypercholesterolemia  3. Hypertension-controlled.  Plan: We discussed potential evaluation and treatment. The patient states that her husband has a lot of AcipHex from prior prescription. I think it would be reasonable to give her a trial of antireflux therapy to see how her symptoms respond. If she has a good response to therapy then I don't think any further evaluation is warranted. If her symptoms do not improve with therapy I would recommend a stress test. She currently has no  insurance and no job so would like to avoid expensive tests if possible. She is going to call if her symptoms do not resolve. I recommend continuing her antihypertensive therapy. She needs to follow a heart healthy diet with focus on weight loss.

## 2013-01-24 ENCOUNTER — Ambulatory Visit
Admission: RE | Admit: 2013-01-24 | Discharge: 2013-01-24 | Disposition: A | Discharge status: Home / Self Care | Payer: No Typology Code available for payment source | Source: Ambulatory Visit | Attending: Internal Medicine | Admitting: Internal Medicine

## 2013-01-24 ENCOUNTER — Ambulatory Visit: Payer: No Typology Code available for payment source | Attending: Internal Medicine | Admitting: Internal Medicine

## 2013-01-24 VITALS — BP 153/80 | HR 75 | Temp 98.3°F | Resp 16 | Wt 214.0 lb

## 2013-01-24 DIAGNOSIS — M25519 Pain in unspecified shoulder: Secondary | ICD-10-CM

## 2013-01-24 DIAGNOSIS — M25511 Pain in right shoulder: Secondary | ICD-10-CM

## 2013-01-24 MED ORDER — LOSARTAN POTASSIUM 50 MG PO TABS: 50.0000 mg | ORAL_TABLET | Freq: Every day | ORAL | Status: DC

## 2013-01-24 NOTE — Progress Notes (Signed)
Pt is here for a f/u from last visit C/o bilateral shoulder pain... Reports she tripped and fell onto carpet flooring Pain increases w/activity... Ambulated well to exam room w/NAD Alert w/no signs of acute distress.

## 2013-01-24 NOTE — Progress Notes (Signed)
Patient ID: Angelica Harvey, female   DOB: 1950/11/26, 62 y.o.   MRN: [id number]   chief complaint Bilateral shoulder joint pains  History of present illness 62 year old female with history of hypertension, depression, acid reflux here for pain over bilateral shoulders for  2 weeks. Patient reports falling at home on an outstretched hand while trying to the bathroom in the night. Since then she has been over bilateral shoulders right greater than left with painful and limited range of motion. She denies any fever, chills, headache, blurred vision, dizziness, chest pain, palpitations, shortness of breath, abdominal pain, pain over any other joints, bowel urinary symptoms.  Objective:  Vital signs in last 24 hours:  Filed Vitals:   01/24/13 1230  BP: 153/80  Pulse: 75  Temp: 98.3 F (36.8 C)  TempSrc: Oral  Resp: 16  Weight: 214 lb (97.07 kg)  SpO2: 98%     Physical Exam:  General: He did aged female in no acute distress. HEENT: no pallor, no icterus, moist oral mucosa,  Heart: Normal  s1 &s2  Regular rate and rhythm, without murmurs, rubs, gallops. Lungs: Clear to auscultation bilaterally. Abdomen: Soft, nontender, nondistended, positive bowel sounds. Extremities: Painful range of motion over bilateral shoulders. No swelling or deformity, no swelling over wrist or hands . Normal range of motion of the elbow and hands.  Neuro: Alert, awake, oriented x3, nonfocal.   Lab Results:  Basic Metabolic Panel:    Component Value Date/Time   NA 138 09/19/2012 1609   K 4.4 09/19/2012 1609   CL 105 09/19/2012 1609   CO2 26 09/19/2012 1609   BUN 12 09/19/2012 1609   CREATININE 0.82 09/19/2012 1609   CREATININE 0.78 05/15/2012 1755   GLUCOSE 95 09/19/2012 1609   CALCIUM 8.7 09/19/2012 1609   CBC:    Component Value Date/Time   WBC 10.1 09/19/2012 1609   HGB 12.9 09/19/2012 1609   HCT 39.0 09/19/2012 1609   PLT 231 09/19/2012 1609   MCV 81.9 09/19/2012 1609   NEUTROABS 5.8 07/25/2008 1615   LYMPHSABS 1.7 07/25/2008 1615   MONOABS 0.3 07/25/2008 1615   EOSABS 0.1 07/25/2008 1615   BASOSABS 0.1 07/25/2008 1615    No results found for this or any previous visit (from the past 240 hour(s)).  Studies/Results: No results found.  Medications: Scheduled Meds: Continuous Infusions: PRN Meds:.    Assessment/Plan:  Bilateral shoulder pain Following a fall at home 2 weeks back beginning check x-ray although the shoulders., When necessary for pain and heat compress to the shoulder as needed. Will  followup with results  GERD Resolved after dietary modifications and being on PPI  Hypertension BP well controlled. Will refill losartan  Health maintenance Refer  to GYN for Pap and mammogram  Follow  up in 6 months  Derak Schurman 01/24/2013, 1:27 PM

## 2013-03-19 ENCOUNTER — Ambulatory Visit (HOSPITAL_COMMUNITY)
Admission: RE | Admit: 2013-03-19 | Discharge: 2013-03-19 | Disposition: A | Discharge status: Home / Self Care | Payer: No Typology Code available for payment source | Source: Ambulatory Visit | Attending: Internal Medicine | Admitting: Internal Medicine

## 2013-03-19 ENCOUNTER — Ambulatory Visit: Payer: No Typology Code available for payment source | Attending: Internal Medicine | Admitting: Internal Medicine

## 2013-03-19 ENCOUNTER — Encounter: Payer: Self-pay | Admitting: Internal Medicine

## 2013-03-19 VITALS — BP 147/82 | HR 70 | Temp 98.4°F | Resp 16 | Ht 63.0 in | Wt 213.0 lb

## 2013-03-19 DIAGNOSIS — R059 Cough, unspecified: Secondary | ICD-10-CM | POA: Insufficient documentation

## 2013-03-19 DIAGNOSIS — E785 Hyperlipidemia, unspecified: Secondary | ICD-10-CM

## 2013-03-19 DIAGNOSIS — F329 Major depressive disorder, single episode, unspecified: Secondary | ICD-10-CM

## 2013-03-19 DIAGNOSIS — R05 Cough: Secondary | ICD-10-CM | POA: Insufficient documentation

## 2013-03-19 DIAGNOSIS — I10 Essential (primary) hypertension: Secondary | ICD-10-CM

## 2013-03-19 DIAGNOSIS — R0602 Shortness of breath: Secondary | ICD-10-CM | POA: Insufficient documentation

## 2013-03-19 MED ORDER — LOSARTAN POTASSIUM 50 MG PO TABS: 50.0000 mg | ORAL_TABLET | Freq: Every day | ORAL | Status: DC

## 2013-03-19 MED ORDER — CITALOPRAM HYDROBROMIDE 20 MG PO TABS: 40.0000 mg | ORAL_TABLET | Freq: Every day | ORAL | Status: DC

## 2013-03-19 NOTE — Patient Instructions (Signed)

## 2013-03-19 NOTE — Progress Notes (Signed)
Pt here with c/o SOB with chronic dry,hacking cough x 5 mnths Denies CP, slight tightness Sats 93 %

## 2013-03-19 NOTE — Progress Notes (Signed)
Patient ID: Angelica Harvey, female   DOB: 1951/01/16, 62 y.o.   MRN: [id number]  CC: Cough  HPI: 62 year old female with past medical history of hypertension, depression who presented to clinic for evaluation of chronic cough, shortness of breath. Patient reports that smoke irritates her airway is and she starts coughing. She reports that having fresh air helps alleviate cough. Sometimes she coughs at nighttime but this is inconsistent. She also reports associated shortness of breath with coughing. She does not use any inhalers.  Allergies  Allergen Reactions  . Belladonna     REACTION: rash  . Hctz [Hydrochlorothiazide]   . Phenobarbital     REACTION: rash  . Quinine     REACTION: rash   Past Medical History  Diagnosis Date  . Dyslipidemia   . Depression   . Hypertension   . History of hypokalemia   . Hypercholesterolemia    Current Outpatient Prescriptions on File Prior to Visit  Medication Sig Dispense Refill  . citalopram (CELEXA) 20 MG tablet Take 40 mg by mouth daily.      Marland Kitchen losartan (COZAAR) 50 MG tablet Take 1 tablet (50 mg total) by mouth daily.  30 tablet  5  . Lactobacillus (PROBIOTIC ACIDOPHILUS PO) Take by mouth daily.      . Multiple Vitamin (MULTIVITAMIN) tablet Take 1 tablet by mouth daily.      . RABEprazole (ACIPHEX) 20 MG tablet Take 1 tablet (20 mg total) by mouth daily.       No current facility-administered medications on file prior to visit.   Family History  Problem Relation Age of Onset  . Heart attack Father   . Heart disease Father     CABG   History   Social History  . Marital Status: Married    Spouse Name: N/A    Number of Children: 1  . Years of Education: N/A   Occupational History  . restaurant    Social History Main Topics  . Smoking status: Former Smoker -- 1.00 packs/day for 17 years    Quit date: 04/12/1992  . Smokeless tobacco: Not on file  . Alcohol Use: Not on file  . Drug Use: Not on file  . Sexual Activity: Not on file    Other Topics Concern  . Not on file   Social History Narrative  . No narrative on file    Review of Systems  Constitutional: Negative for fever, chills, diaphoresis, activity change, appetite change and fatigue.  HENT: Negative for ear pain, nosebleeds, congestion, facial swelling, rhinorrhea, neck pain, neck stiffness and ear discharge.   Eyes: Negative for pain, discharge, redness, itching and visual disturbance.  Respiratory: Negative for cough, choking, chest tightness, positive shortness of breath, negative for wheezing and stridor.   Cardiovascular: Negative for chest pain, palpitations and leg swelling.  Gastrointestinal: Negative for abdominal distention.  Genitourinary: Negative for dysuria, urgency, frequency, hematuria, flank pain, decreased urine volume, difficulty urinating and dyspareunia.  Musculoskeletal: Negative for back pain, joint swelling, arthralgias and gait problem.  Neurological: Negative for dizziness, tremors, seizures, syncope, facial asymmetry, speech difficulty, weakness, light-headedness, numbness and headaches.  Hematological: Negative for adenopathy. Does not bruise/bleed easily.  Psychiatric/Behavioral: Negative for hallucinations, behavioral problems, confusion, dysphoric mood, decreased concentration and agitation.    Objective:   Filed Vitals:   03/19/13 1537  BP: 147/82  Pulse: 70  Temp: 98.4 F (36.9 C)  Resp: 16    Physical Exam  Constitutional: Appears well-developed and well-nourished. No distress.  HENT: Normocephalic. External right and left ear normal. Oropharynx is clear and moist.  Eyes: Conjunctivae and EOM are normal. PERRLA, no scleral icterus.  Neck: Normal ROM. Neck supple. No JVD. No tracheal deviation. No thyromegaly.  CVS: RRR, S1/S2 +, no murmurs, no gallops, no carotid bruit.  Pulmonary: Effort and breath sounds normal, no stridor, rhonchi, wheezes, rales.  Abdominal: Soft. BS +,  no distension, tenderness, rebound or  guarding.  Musculoskeletal: Normal range of motion. No edema and no tenderness.  Lymphadenopathy: No lymphadenopathy noted, cervical, inguinal. Neuro: Alert. Normal reflexes, muscle tone coordination. No cranial nerve deficit. Skin: Skin is warm and dry. No rash noted. Not diaphoretic. No erythema. No pallor.  Psychiatric: Normal mood and affect. Behavior, judgment, thought content normal.   Lab Results  Component Value Date   WBC 10.1 09/19/2012   HGB 12.9 09/19/2012   HCT 39.0 09/19/2012   MCV 81.9 09/19/2012   PLT 231 09/19/2012   Lab Results  Component Value Date   CREATININE 0.82 09/19/2012   BUN 12 09/19/2012   NA 138 09/19/2012   K 4.4 09/19/2012   CL 105 09/19/2012   CO2 26 09/19/2012    Lab Results  Component Value Date   HGBA1C 5.6 % 09/19/2012   Lipid Panel     Component Value Date/Time   CHOL 185 09/19/2012 1609   TRIG 143 09/19/2012 1609   HDL 38* 09/19/2012 1609   CHOLHDL 4.9 09/19/2012 1609   VLDL 29 09/19/2012 1609   LDLCALC 118* 09/19/2012 1609       Assessment and plan:   Patient Active Problem List   Diagnosis Date Noted  . Preventive health 09/19/2012    Priority: Medium - Check lipid panel today  - check A1c today - CXR for eval of shortness of breath  . DEPRESSION 07/22/2008    Priority: Medium - Continue Celexa daily 20 mg   . HYPERTENSION 07/22/2008    Priority: Medium - We have discussed target BP range - I have advised pt to check BP regularly and to call us back if the numbers are higher than 140/90 - discussed the importance of compliance with medical therapy and diet  - Continue losartan 50 mg a day

## 2013-03-20 MED ORDER — SIMVASTATIN 10 MG PO TABS: 10.0000 mg | ORAL_TABLET | Freq: Every day | ORAL | Status: DC

## 2013-03-20 NOTE — Addendum Note (Signed)
Addended by: Alison Murray on: 03/20/2013 03:04 PM   Modules accepted: Orders

## 2013-04-26 ENCOUNTER — Ambulatory Visit: Payer: Self-pay

## 2013-05-25 ENCOUNTER — Encounter: Payer: Self-pay | Admitting: Internal Medicine

## 2013-05-25 ENCOUNTER — Ambulatory Visit: Payer: No Typology Code available for payment source | Attending: Internal Medicine | Admitting: Internal Medicine

## 2013-05-25 VITALS — BP 160/80 | HR 71 | Temp 98.4°F | Resp 17

## 2013-05-25 DIAGNOSIS — F411 Generalized anxiety disorder: Secondary | ICD-10-CM

## 2013-05-25 DIAGNOSIS — I10 Essential (primary) hypertension: Secondary | ICD-10-CM

## 2013-05-25 DIAGNOSIS — F329 Major depressive disorder, single episode, unspecified: Secondary | ICD-10-CM | POA: Insufficient documentation

## 2013-05-25 DIAGNOSIS — E785 Hyperlipidemia, unspecified: Secondary | ICD-10-CM

## 2013-05-25 DIAGNOSIS — Z951 Presence of aortocoronary bypass graft: Secondary | ICD-10-CM | POA: Insufficient documentation

## 2013-05-25 DIAGNOSIS — R05 Cough: Secondary | ICD-10-CM

## 2013-05-25 DIAGNOSIS — Z87891 Personal history of nicotine dependence: Secondary | ICD-10-CM | POA: Insufficient documentation

## 2013-05-25 DIAGNOSIS — F3289 Other specified depressive episodes: Secondary | ICD-10-CM | POA: Insufficient documentation

## 2013-05-25 DIAGNOSIS — R0981 Nasal congestion: Secondary | ICD-10-CM

## 2013-05-25 DIAGNOSIS — R0789 Other chest pain: Secondary | ICD-10-CM

## 2013-05-25 DIAGNOSIS — F419 Anxiety disorder, unspecified: Secondary | ICD-10-CM

## 2013-05-25 DIAGNOSIS — J3489 Other specified disorders of nose and nasal sinuses: Secondary | ICD-10-CM | POA: Insufficient documentation

## 2013-05-25 DIAGNOSIS — E7800 Pure hypercholesterolemia, unspecified: Secondary | ICD-10-CM | POA: Insufficient documentation

## 2013-05-25 DIAGNOSIS — Z950 Presence of cardiac pacemaker: Secondary | ICD-10-CM | POA: Insufficient documentation

## 2013-05-25 DIAGNOSIS — R059 Cough, unspecified: Secondary | ICD-10-CM

## 2013-05-25 MED ORDER — LOSARTAN POTASSIUM 100 MG PO TABS: 100.0000 mg | ORAL_TABLET | Freq: Every day | ORAL | Status: DC

## 2013-05-25 MED ORDER — FLUTICASONE PROPIONATE 50 MCG/ACT NA SUSP: 2.0000 | Freq: Every day | NASAL | Status: DC

## 2013-05-25 MED ORDER — BENZONATATE 100 MG PO CAPS: 100.0000 mg | ORAL_CAPSULE | Freq: Three times a day (TID) | ORAL | Status: DC | PRN

## 2013-05-25 NOTE — Patient Instructions (Signed)
DASH Diet  The DASH diet stands for "Dietary Approaches to Stop Hypertension." It is a healthy eating plan that has been shown to reduce high blood pressure (hypertension) in as little as 14 days, while also possibly providing other significant health benefits. These other health benefits include reducing the risk of breast cancer after menopause and reducing the risk of type 2 diabetes, heart disease, colon cancer, and stroke. Health benefits also include weight loss and slowing kidney failure in patients with chronic kidney disease.   DIET GUIDELINES  · Limit salt (sodium). Your diet should contain less than 1500 mg of sodium daily.  · Limit refined or processed carbohydrates. Your diet should include mostly whole grains. Desserts and added sugars should be used sparingly.  · Include small amounts of heart-healthy fats. These types of fats include nuts, oils, and tub margarine. Limit saturated and trans fats. These fats have been shown to be harmful in the body.  CHOOSING FOODS   The following food groups are based on a 2000 calorie diet. See your Registered Dietitian for individual calorie needs.  Grains and Grain Products (6 to 8 servings daily)  · Eat More Often: Whole-wheat bread, brown rice, whole-grain or wheat pasta, quinoa, popcorn without added fat or salt (air popped).  · Eat Less Often: White bread, white pasta, white rice, cornbread.  Vegetables (4 to 5 servings daily)  · Eat More Often: Fresh, frozen, and canned vegetables. Vegetables may be raw, steamed, roasted, or grilled with a minimal amount of fat.  · Eat Less Often/Avoid: Creamed or fried vegetables. Vegetables in a cheese sauce.  Fruit (4 to 5 servings daily)  · Eat More Often: All fresh, canned (in natural juice), or frozen fruits. Dried fruits without added sugar. One hundred percent fruit juice (½ cup [237 mL] daily).  · Eat Less Often: Dried fruits with added sugar. Canned fruit in light or heavy syrup.  Lean Meats, Fish, and Poultry (2  servings or less daily. One serving is 3 to 4 oz [85-114 g]).  · Eat More Often: Ninety percent or leaner ground beef, tenderloin, sirloin. Round cuts of beef, chicken breast, turkey breast. All fish. Grill, bake, or broil your meat. Nothing should be fried.  · Eat Less Often/Avoid: Fatty cuts of meat, turkey, or chicken leg, thigh, or wing. Fried cuts of meat or fish.  Dairy (2 to 3 servings)  · Eat More Often: Low-fat or fat-free milk, low-fat plain or light yogurt, reduced-fat or part-skim cheese.  · Eat Less Often/Avoid: Milk (whole, 2%). Whole milk yogurt. Full-fat cheeses.  Nuts, Seeds, and Legumes (4 to 5 servings per week)  · Eat More Often: All without added salt.  · Eat Less Often/Avoid: Salted nuts and seeds, canned beans with added salt.  Fats and Sweets (limited)  · Eat More Often: Vegetable oils, tub margarines without trans fats, sugar-free gelatin. Mayonnaise and salad dressings.  · Eat Less Often/Avoid: Coconut oils, palm oils, butter, stick margarine, cream, half and half, cookies, candy, pie.  FOR MORE INFORMATION  The Dash Diet Eating Plan: www.dashdiet.org  Document Released: 03/18/2011 Document Revised: 06/21/2011 Document Reviewed: 03/18/2011  ExitCare® Patient Information ©2014 [employer].

## 2013-05-25 NOTE — Progress Notes (Signed)
Patient here for cough and congestion States has uncomfortable feeling in her chest

## 2013-05-25 NOTE — Progress Notes (Signed)
MRN: [id number] Name: Angelica Harvey  Sex: female Age: 63 y.o. DOB: April 21, 1950  Allergies: Belladonna; Hctz; Phenobarbital; and Quinine  Chief Complaint  Patient presents with  . Cough    HPI: Patient is 62 y.o. female who history of hypertension, depression, hyperlipidemia comes today reported to have chronic cough, EMR reviewed she had x-ray done which was negative she complains of stuffy nose denies any fever chills reported to have some chest pressure denies any radiation to the neck or arm, today EKG done which is unchanged, patient attributes the symptoms for being under a lot of stress and anxiety since she lost her job 2 days ago, denies any SI or HI she has been taking Celexa 20 mg daily, patient has not been taking her cholesterol medication wants to modify her diet today her blood pressure was also elevated, denies any headache dizziness blurry pacemaker blood pressure is 160/80.  Past Medical History  Diagnosis Date  . Dyslipidemia   . Depression   . Hypertension   . History of hypokalemia   . Hypercholesterolemia     Past Surgical History  Procedure Laterality Date  . Cesarean section        Medication List       This list is accurate as of: 05/25/13  3:36 PM.  Always use your most recent med list.               benzonatate 100 MG capsule  Commonly known as:  TESSALON  Take 1 capsule (100 mg total) by mouth 3 (three) times daily as needed for cough.     citalopram 20 MG tablet  Commonly known as:  CELEXA  Take 2 tablets (40 mg total) by mouth daily.     fluticasone 50 MCG/ACT nasal spray  Commonly known as:  FLONASE  Place 2 sprays into both nostrils daily.     losartan 100 MG tablet  Commonly known as:  COZAAR  Take 1 tablet (100 mg total) by mouth daily.     multivitamin tablet  Take 1 tablet by mouth daily.     PROBIOTIC ACIDOPHILUS PO  Take by mouth daily.     RABEprazole 20 MG tablet  Commonly known as:  ACIPHEX  Take 1 tablet (20 mg  total) by mouth daily.     simvastatin 10 MG tablet  Commonly known as:  ZOCOR  Take 1 tablet (10 mg total) by mouth at bedtime.        Meds ordered this encounter  Medications  . fluticasone (FLONASE) 50 MCG/ACT nasal spray    Sig: Place 2 sprays into both nostrils daily.    Dispense:  16 g    Refill:  1  . losartan (COZAAR) 100 MG tablet    Sig: Take 1 tablet (100 mg total) by mouth daily.    Dispense:  30 tablet    Refill:  5  . benzonatate (TESSALON) 100 MG capsule    Sig: Take 1 capsule (100 mg total) by mouth 3 (three) times daily as needed for cough.    Dispense:  30 capsule    Refill:  1     There is no immunization history on file for this patient.  Family History  Problem Relation Age of Onset  . Heart attack Father   . Heart disease Father     CABG    History  Substance Use Topics  . Smoking status: Former Smoker -- 1.00 packs/day for 17 years  Quit date: 04/12/1992  . Smokeless tobacco: Not on file  . Alcohol Use: Not on file    Review of Systems   As noted in HPI  Filed Vitals:   05/25/13 1531  BP: 160/80  Pulse:   Temp:   Resp:     Physical Exam  Physical Exam  Constitutional: No distress.  HENT:  Some nasal congestion, no sinus tenderness   Eyes: EOM are normal. Pupils are equal, round, and reactive to light.  Cardiovascular: Normal rate and regular rhythm.   Pulmonary/Chest: Breath sounds normal. No respiratory distress. She has no wheezes. She has no rales.  Musculoskeletal: She exhibits no edema.    CBC    Component Value Date/Time   WBC 10.1 09/19/2012 1609   RBC 4.76 09/19/2012 1609   HGB 12.9 09/19/2012 1609   HCT 39.0 09/19/2012 1609   PLT 231 09/19/2012 1609   MCV 81.9 09/19/2012 1609   LYMPHSABS 1.7 07/25/2008 1615   MONOABS 0.3 07/25/2008 1615   EOSABS 0.1 07/25/2008 1615   BASOSABS 0.1 07/25/2008 1615    CMP     Component Value Date/Time   NA 138 09/19/2012 1609   K 4.4 09/19/2012 1609   CL 105 09/19/2012 1609    CO2 26 09/19/2012 1609   GLUCOSE 95 09/19/2012 1609   BUN 12 09/19/2012 1609   CREATININE 0.82 09/19/2012 1609   CREATININE 0.78 05/15/2012 1755   CALCIUM 8.7 09/19/2012 1609   PROT 6.2 09/19/2012 1609   ALBUMIN 4.0 09/19/2012 1609   AST 24 09/19/2012 1609   ALT 25 09/19/2012 1609   ALKPHOS 90 09/19/2012 1609   BILITOT 0.6 09/19/2012 1609   GFRNONAA 89* 05/15/2012 1755   GFRAA >90 05/15/2012 1755    Lab Results  Component Value Date/Time   CHOL 219* 03/19/2013  3:52 PM    No components found with this basename: hga1c    Lab Results  Component Value Date/Time   AST 24 09/19/2012  4:09 PM    Assessment and Plan  Chest discomfort - Plan: EKG 12-Lead unchanged. No acute changes noticed.  HYPERTENSION uncontrolled- Plan: Advised patient for low salt diet, I have increased the dose of Cozaar 200 mg daily will repeat her blood chemistry losartan (COZAAR) 100 MG tablet, COMPLETE METABOLIC PANEL WITH GFR  Nasal congestion - Plan: fluticasone (FLONASE) 50 MCG/ACT nasal spray  Anxiety . Continue with her Celexa 20 mg daily..  Cough - Plan: benzonatate (TESSALON) 100 MG capsule  Dyslipidemia - Plan:  fasting Lipid panelPrior to the next visit.   Return in about 3 months (around 08/22/2013) for BP check in 2 weeks .  Angelica Marek, MD

## 2013-06-20 ENCOUNTER — Ambulatory Visit: Payer: No Typology Code available for payment source | Attending: Internal Medicine | Admitting: Internal Medicine

## 2013-06-20 ENCOUNTER — Encounter: Payer: Self-pay | Admitting: Internal Medicine

## 2013-06-20 VITALS — BP 145/85 | HR 74 | Temp 98.4°F | Resp 17 | Wt 214.4 lb

## 2013-06-20 DIAGNOSIS — F32A Depression, unspecified: Secondary | ICD-10-CM | POA: Insufficient documentation

## 2013-06-20 DIAGNOSIS — F3289 Other specified depressive episodes: Secondary | ICD-10-CM | POA: Insufficient documentation

## 2013-06-20 DIAGNOSIS — Z09 Encounter for follow-up examination after completed treatment for conditions other than malignant neoplasm: Secondary | ICD-10-CM | POA: Insufficient documentation

## 2013-06-20 DIAGNOSIS — R059 Cough, unspecified: Secondary | ICD-10-CM | POA: Insufficient documentation

## 2013-06-20 DIAGNOSIS — F329 Major depressive disorder, single episode, unspecified: Secondary | ICD-10-CM

## 2013-06-20 DIAGNOSIS — E785 Hyperlipidemia, unspecified: Secondary | ICD-10-CM | POA: Insufficient documentation

## 2013-06-20 DIAGNOSIS — R05 Cough: Secondary | ICD-10-CM

## 2013-06-20 DIAGNOSIS — I10 Essential (primary) hypertension: Secondary | ICD-10-CM | POA: Insufficient documentation

## 2013-06-20 MED ORDER — BENZONATATE 100 MG PO CAPS: 100.0000 mg | ORAL_CAPSULE | Freq: Three times a day (TID) | ORAL | Status: DC | PRN

## 2013-06-20 NOTE — Progress Notes (Signed)
Patient here for follow up- HTN Has been keeping track of her blood pressure

## 2013-06-20 NOTE — Patient Instructions (Addendum)
DASH Diet  The DASH diet stands for "Dietary Approaches to Stop Hypertension." It is a healthy eating plan that has been shown to reduce high blood pressure (hypertension) in as little as 14 days, while also possibly providing other significant health benefits. These other health benefits include reducing the risk of breast cancer after menopause and reducing the risk of type 2 diabetes, heart disease, colon cancer, and stroke. Health benefits also include weight loss and slowing kidney failure in patients with chronic kidney disease.   DIET GUIDELINES  · Limit salt (sodium). Your diet should contain less than 1500 mg of sodium daily.  · Limit refined or processed carbohydrates. Your diet should include mostly whole grains. Desserts and added sugars should be used sparingly.  · Include small amounts of heart-healthy fats. These types of fats include nuts, oils, and tub margarine. Limit saturated and trans fats. These fats have been shown to be harmful in the body.  CHOOSING FOODS   The following food groups are based on a 2000 calorie diet. See your Registered Dietitian for individual calorie needs.  Grains and Grain Products (6 to 8 servings daily)  · Eat More Often: Whole-wheat bread, brown rice, whole-grain or wheat pasta, quinoa, popcorn without added fat or salt (air popped).  · Eat Less Often: White bread, white pasta, white rice, cornbread.  Vegetables (4 to 5 servings daily)  · Eat More Often: Fresh, frozen, and canned vegetables. Vegetables may be raw, steamed, roasted, or grilled with a minimal amount of fat.  · Eat Less Often/Avoid: Creamed or fried vegetables. Vegetables in a cheese sauce.  Fruit (4 to 5 servings daily)  · Eat More Often: All fresh, canned (in natural juice), or frozen fruits. Dried fruits without added sugar. One hundred percent fruit juice (½ cup [237 mL] daily).  · Eat Less Often: Dried fruits with added sugar. Canned fruit in light or heavy syrup.  Lean Meats, Fish, and Poultry (2  servings or less daily. One serving is 3 to 4 oz [85-114 g]).  · Eat More Often: Ninety percent or leaner ground beef, tenderloin, sirloin. Round cuts of beef, chicken breast, turkey breast. All fish. Grill, bake, or broil your meat. Nothing should be fried.  · Eat Less Often/Avoid: Fatty cuts of meat, turkey, or chicken leg, thigh, or wing. Fried cuts of meat or fish.  Dairy (2 to 3 servings)  · Eat More Often: Low-fat or fat-free milk, low-fat plain or light yogurt, reduced-fat or part-skim cheese.  · Eat Less Often/Avoid: Milk (whole, 2%). Whole milk yogurt. Full-fat cheeses.  Nuts, Seeds, and Legumes (4 to 5 servings per week)  · Eat More Often: All without added salt.  · Eat Less Often/Avoid: Salted nuts and seeds, canned beans with added salt.  Fats and Sweets (limited)  · Eat More Often: Vegetable oils, tub margarines without trans fats, sugar-free gelatin. Mayonnaise and salad dressings.  · Eat Less Often/Avoid: Coconut oils, palm oils, butter, stick margarine, cream, half and half, cookies, candy, pie.  FOR MORE INFORMATION  The Dash Diet Eating Plan: www.dashdiet.org  Document Released: 03/18/2011 Document Revised: 06/21/2011 Document Reviewed: 03/18/2011  ExitCare® Patient Information ©2014 [employer].  Fat and Cholesterol Control Diet  Fat and cholesterol levels in your blood and organs are influenced by your diet. High levels of fat and cholesterol may lead to diseases of the heart, small and large blood vessels, gallbladder, liver, and pancreas.  CONTROLLING FAT AND CHOLESTEROL WITH DIET  Although exercise and lifestyle factors   replace saturated and trans fat with other types of fat, such as monounsaturated fat, polyunsaturated fat, and omega-3 fatty acids. On average, a person should consume no  more than 15 to 17 g of saturated fat daily. Saturated and trans fats are considered "bad" fats, and they will raise LDL cholesterol. Saturated fats are primarily found in animal products such as meats, butter, and cream. However, that does not mean you need to give up all your favorite foods. Today, there are good tasting, low-fat, low-cholesterol substitutes for most of the things you like to eat. Choose low-fat or nonfat alternatives. Choose round or loin cuts of red meat. These types of cuts are lowest in fat and cholesterol. Chicken (without the skin), fish, veal, and ground [place] breast are great choices. Eliminate fatty meats, such as hot dogs and salami. Even shellfish have little or no saturated fat. Have a 3 oz (85 g) portion when you eat lean meat, poultry, or fish. Trans fats are also called "partially hydrogenated oils." They are oils that have been scientifically manipulated so that they are solid at room temperature resulting in a longer shelf life and improved taste and texture of foods in which they are added. Trans fats are found in stick margarine, some tub margarines, cookies, crackers, and baked goods.  When baking and cooking, oils are a great substitute for butter. The monounsaturated oils are especially beneficial since it is believed they lower LDL and raise HDL. The oils you should avoid entirely are saturated tropical oils, such as coconut and palm.  Remember to eat a lot from food groups that are naturally free of saturated and trans fat, including fish, fruit, vegetables, beans, grains (barley, rice, couscous, bulgur wheat), and pasta (without cream sauces).  IDENTIFYING FOODS THAT LOWER FAT AND CHOLESTEROL  Soluble fiber may lower your cholesterol. This type of fiber is found in fruits such as apples, vegetables such as broccoli, potatoes, and carrots, legumes such as beans, peas, and lentils, and grains such as barley. Foods fortified with plant sterols (phytosterol) may also  lower cholesterol. You should eat at least 2 g per day of these foods for a cholesterol lowering effect.  Read package labels to identify low-saturated fats, trans fat free, and low-fat foods at the supermarket. Select cheeses that have only 2 to 3 g saturated fat per ounce. Use a heart-healthy tub margarine that is free of trans fats or partially hydrogenated oil. When buying baked goods (cookies, crackers), avoid partially hydrogenated oils. Breads and muffins should be made from whole grains (whole-wheat or whole oat flour, instead of "flour" or "enriched flour"). Buy non-creamy canned soups with reduced salt and no added fats.  FOOD PREPARATION TECHNIQUES  Never deep-fry. If you must fry, either stir-fry, which uses very little fat, or use non-stick cooking sprays. When possible, broil, bake, or roast meats, and steam vegetables. Instead of putting butter or margarine on vegetables, use lemon and herbs, applesauce, and cinnamon (for squash and sweet potatoes). Use nonfat yogurt, salsa, and low-fat dressings for salads.  LOW-SATURATED FAT / LOW-FAT FOOD SUBSTITUTES Meats / Saturated Fat (g)  Avoid: Steak, marbled (3 oz/85 g) / 11 g  Choose: Steak, lean (3 oz/85 g) / 4 g  Avoid: Hamburger (3 oz/85 g) / 7 g  Choose: Hamburger, lean (3 oz/85 g) / 5 g  Avoid: Ham (3 oz/85 g) / 6 g  Choose: Ham, lean cut (3 oz/85 g) / 2.4 g  Avoid: Chicken, with skin, dark meat (3 oz/85  g) / 4 g  Choose: Chicken, skin removed, dark meat (3 oz/85 g) / 2 g  Avoid: Chicken, with skin, light meat (3 oz/85 g) / 2.5 g  Choose: Chicken, skin removed, light meat (3 oz/85 g) / 1 g Dairy / Saturated Fat (g)  Avoid: Whole milk (1 cup) / 5 g  Choose: Low-fat milk, 2% (1 cup) / 3 g  Choose: Low-fat milk, 1% (1 cup) / 1.5 g  Choose: Skim milk (1 cup) / 0.3 g  Avoid: Hard cheese (1 oz/28 g) / 6 g  Choose: Skim milk cheese (1 oz/28 g) / 2 to 3 g  Avoid: Cottage cheese, 4% fat (1 cup) / 6.5 g  Choose: Low-fat  cottage cheese, 1% fat (1 cup) / 1.5 g  Avoid: Ice cream (1 cup) / 9 g  Choose: Sherbet (1 cup) / 2.5 g  Choose: Nonfat frozen yogurt (1 cup) / 0.3 g  Choose: Frozen fruit bar / trace  Avoid: Whipped cream (1 tbs) / 3.5 g  Choose: Nondairy whipped topping (1 tbs) / 1 g Condiments / Saturated Fat (g)  Avoid: Mayonnaise (1 tbs) / 2 g  Choose: Low-fat mayonnaise (1 tbs) / 1 g  Avoid: Butter (1 tbs) / 7 g  Choose: Extra light margarine (1 tbs) / 1 g  Avoid: Coconut oil (1 tbs) / 11.8 g  Choose: Olive oil (1 tbs) / 1.8 g  Choose: Corn oil (1 tbs) / 1.7 g  Choose: Safflower oil (1 tbs) / 1.2 g  Choose: Sunflower oil (1 tbs) / 1.4 g  Choose: Soybean oil (1 tbs) / 2.4 g  Choose: Canola oil (1 tbs) / 1 g Document Released: 03/29/2005 Document Revised: 07/24/2012 Document Reviewed: 09/17/2010 Exit[employer] Patient Information 2014 [place], [place].

## 2013-06-20 NOTE — Progress Notes (Signed)
MRN: [id number] Name: Angelica Harvey  Sex: female Age: 63 y.o. DOB: Sep 05, 1950  Allergies: Belladonna; Hctz; Phenobarbital; and Quinine  Chief Complaint  Patient presents with  . Follow-up    HPI: Patient is 63 y.o. female who has history of hypertension, hyperlipidemia, depression comes today for followup, on the last visit her losartan was increased, her blood pressure trend has improved denies any headache dizziness chest and shortness of breath, as per patient she has not been taking Zocor has history of hyperlipidemia, she wants to try diet and exercise first. Patient has occasional cough she is asking for refill on Tessalon Perles, denies any fever chills.  Past Medical History  Diagnosis Date  . Dyslipidemia   . Depression   . Hypertension   . History of hypokalemia   . Hypercholesterolemia     Past Surgical History  Procedure Laterality Date  . Cesarean section        Medication List       This list is accurate as of: 06/20/13  3:34 PM.  Always use your most recent med list.               benzonatate 100 MG capsule  Commonly known as:  TESSALON  Take 1 capsule (100 mg total) by mouth 3 (three) times daily as needed for cough.     citalopram 20 MG tablet  Commonly known as:  CELEXA  Take 2 tablets (40 mg total) by mouth daily.     fluticasone 50 MCG/ACT nasal spray  Commonly known as:  FLONASE  Place 2 sprays into both nostrils daily.     losartan 100 MG tablet  Commonly known as:  COZAAR  Take 1 tablet (100 mg total) by mouth daily.     multivitamin tablet  Take 1 tablet by mouth daily.     PROBIOTIC ACIDOPHILUS PO  Take by mouth daily.     RABEprazole 20 MG tablet  Commonly known as:  ACIPHEX  Take 1 tablet (20 mg total) by mouth daily.     simvastatin 10 MG tablet  Commonly known as:  ZOCOR  Take 1 tablet (10 mg total) by mouth at bedtime.        Meds ordered this encounter  Medications  . benzonatate (TESSALON) 100 MG capsule   Sig: Take 1 capsule (100 mg total) by mouth 3 (three) times daily as needed for cough.    Dispense:  30 capsule    Refill:  1     There is no immunization history on file for this patient.  Family History  Problem Relation Age of Onset  . Heart attack Father   . Heart disease Father     CABG    History  Substance Use Topics  . Smoking status: Former Smoker -- 1.00 packs/day for 17 years    Quit date: 04/12/1992  . Smokeless tobacco: Not on file  . Alcohol Use: Not on file    Review of Systems   As noted in HPI  Filed Vitals:   06/20/13 1519  BP: 145/85  Pulse: 74  Temp: 98.4 F (36.9 C)  Resp: 17    Physical Exam  Physical Exam  Constitutional: No distress.  Eyes: EOM are normal. Pupils are equal, round, and reactive to light.  Cardiovascular: Normal rate and regular rhythm.   Pulmonary/Chest: Breath sounds normal. No respiratory distress. She has no wheezes. She has no rales.  Musculoskeletal: She exhibits no edema.    CBC  Component Value Date/Time   WBC 10.1 09/19/2012 1609   RBC 4.76 09/19/2012 1609   HGB 12.9 09/19/2012 1609   HCT 39.0 09/19/2012 1609   PLT 231 09/19/2012 1609   MCV 81.9 09/19/2012 1609   LYMPHSABS 1.7 07/25/2008 1615   MONOABS 0.3 07/25/2008 1615   EOSABS 0.1 07/25/2008 1615   BASOSABS 0.1 07/25/2008 1615    CMP     Component Value Date/Time   NA 138 09/19/2012 1609   K 4.4 09/19/2012 1609   CL 105 09/19/2012 1609   CO2 26 09/19/2012 1609   GLUCOSE 95 09/19/2012 1609   BUN 12 09/19/2012 1609   CREATININE 0.82 09/19/2012 1609   CREATININE 0.78 05/15/2012 1755   CALCIUM 8.7 09/19/2012 1609   PROT 6.2 09/19/2012 1609   ALBUMIN 4.0 09/19/2012 1609   AST 24 09/19/2012 1609   ALT 25 09/19/2012 1609   ALKPHOS 90 09/19/2012 1609   BILITOT 0.6 09/19/2012 1609   GFRNONAA 89* 05/15/2012 1755   GFRAA >90 05/15/2012 1755    Lab Results  Component Value Date/Time   CHOL 219* 03/19/2013  3:52 PM    No components found with this basename: hga1c     Lab Results  Component Value Date/Time   AST 24 09/19/2012  4:09 PM    Assessment and Plan  HYPERTENSION Continue with losartan also advise for low salt diet  Dyslipidemia Patient will try low fat diet and exercise, we'll check fasting lipid panel.  Cough - Plan: benzonatate (TESSALON) 100 MG capsule  Depression Symptoms are stable continue with Celexa 20 mg daily    Jacori Mulrooney, Vernon Prey, MD

## 2013-08-15 ENCOUNTER — Ambulatory Visit: Payer: No Typology Code available for payment source | Attending: Internal Medicine

## 2013-08-15 DIAGNOSIS — E785 Hyperlipidemia, unspecified: Secondary | ICD-10-CM

## 2013-08-15 DIAGNOSIS — I10 Essential (primary) hypertension: Secondary | ICD-10-CM

## 2013-08-15 LAB — COMPLETE METABOLIC PANEL WITH GFR
ALT: 21 U/L (ref 0–35)
AST: 23 U/L (ref 0–37)
Albumin: 4 g/dL (ref 3.5–5.2)
Alkaline Phosphatase: 91 U/L (ref 39–117)
BILIRUBIN TOTAL: 0.6 mg/dL (ref 0.2–1.2)
BUN: 11 mg/dL (ref 6–23)
CO2: 26 meq/L (ref 19–32)
CREATININE: 0.91 mg/dL (ref 0.50–1.10)
Calcium: 8.6 mg/dL (ref 8.4–10.5)
Chloride: 100 mEq/L (ref 96–112)
GFR, EST AFRICAN AMERICAN: 79 mL/min
GFR, Est Non African American: 68 mL/min
Glucose, Bld: 90 mg/dL (ref 70–99)
Potassium: 4.4 mEq/L (ref 3.5–5.3)
Sodium: 134 mEq/L — ABNORMAL LOW (ref 135–145)
Total Protein: 6.4 g/dL (ref 6.0–8.3)

## 2013-08-15 LAB — LIPID PANEL
Cholesterol: 202 mg/dL — ABNORMAL HIGH (ref 0–200)
HDL: 42 mg/dL (ref >39)
LDL CALC: 133 mg/dL — AB (ref 0–99)
TRIGLYCERIDES: 133 mg/dL (ref <150)
Total CHOL/HDL Ratio: 4.8 Ratio
VLDL: 27 mg/dL (ref 0–40)

## 2013-08-16 ENCOUNTER — Telehealth: Payer: Self-pay

## 2013-08-16 NOTE — Telephone Encounter (Signed)
Patient is aware of her lab results 

## 2013-08-16 NOTE — Telephone Encounter (Signed)
Message copied by Dorothe Pea on Thu Aug 16, 2013 11:43 AM ------      Message from: Lorayne Marek      Created: Thu Aug 16, 2013 10:37 AM       Blood work reviewed, call and that the patient know that her cholesterol is improving, continue with diet modification. ------

## 2013-08-22 ENCOUNTER — Encounter: Payer: Self-pay | Admitting: Internal Medicine

## 2013-08-22 ENCOUNTER — Ambulatory Visit: Payer: No Typology Code available for payment source | Attending: Internal Medicine | Admitting: Internal Medicine

## 2013-08-22 VITALS — BP 144/84 | HR 67 | Temp 98.8°F | Resp 16 | Wt 219.8 lb

## 2013-08-22 DIAGNOSIS — K219 Gastro-esophageal reflux disease without esophagitis: Secondary | ICD-10-CM

## 2013-08-22 DIAGNOSIS — Z79899 Other long term (current) drug therapy: Secondary | ICD-10-CM | POA: Insufficient documentation

## 2013-08-22 DIAGNOSIS — Z1211 Encounter for screening for malignant neoplasm of colon: Secondary | ICD-10-CM

## 2013-08-22 DIAGNOSIS — R1013 Epigastric pain: Secondary | ICD-10-CM | POA: Insufficient documentation

## 2013-08-22 DIAGNOSIS — E7800 Pure hypercholesterolemia, unspecified: Secondary | ICD-10-CM | POA: Insufficient documentation

## 2013-08-22 DIAGNOSIS — E785 Hyperlipidemia, unspecified: Secondary | ICD-10-CM

## 2013-08-22 DIAGNOSIS — F3289 Other specified depressive episodes: Secondary | ICD-10-CM

## 2013-08-22 DIAGNOSIS — F329 Major depressive disorder, single episode, unspecified: Secondary | ICD-10-CM

## 2013-08-22 DIAGNOSIS — R1110 Vomiting, unspecified: Secondary | ICD-10-CM | POA: Insufficient documentation

## 2013-08-22 DIAGNOSIS — Z139 Encounter for screening, unspecified: Secondary | ICD-10-CM

## 2013-08-22 DIAGNOSIS — I10 Essential (primary) hypertension: Secondary | ICD-10-CM

## 2013-08-22 DIAGNOSIS — Z87891 Personal history of nicotine dependence: Secondary | ICD-10-CM | POA: Insufficient documentation

## 2013-08-22 MED ORDER — FAMOTIDINE 20 MG PO TABS: 20.0000 mg | ORAL_TABLET | Freq: Two times a day (BID) | ORAL | Status: DC

## 2013-08-22 NOTE — Progress Notes (Signed)
MRN: [id number] Name: Angelica Harvey  Sex: female Age: 63 y.o. DOB: 10-23-1950  Allergies: Belladonna; Hctz; Phenobarbital; and Quinine  Chief Complaint  Patient presents with  . Follow-up    HPI: Patient is 63 y.o. female who has to of hypertension, hyperlipidemia, depression comes today for followup, she has been trying to modify her diet, she reported to have dyspepsia and GERD symptoms and recently vomited and noticed some blood currently denies any nausea vomiting as per patient she had a colonoscopy done more than 5 years ago and at that time polyp was removed, she denies any change in bowel habits.  Past Medical History  Diagnosis Date  . Dyslipidemia   . Depression   . Hypertension   . History of hypokalemia   . Hypercholesterolemia     Past Surgical History  Procedure Laterality Date  . Cesarean section        Medication List       This list is accurate as of: 08/22/13  2:34 PM.  Always use your most recent med list.               benzonatate 100 MG capsule  Commonly known as:  TESSALON  Take 1 capsule (100 mg total) by mouth 3 (three) times daily as needed for cough.     citalopram 20 MG tablet  Commonly known as:  CELEXA  Take 2 tablets (40 mg total) by mouth daily.     famotidine 20 MG tablet  Commonly known as:  PEPCID  Take 1 tablet (20 mg total) by mouth 2 (two) times daily.     fluticasone 50 MCG/ACT nasal spray  Commonly known as:  FLONASE  Place 2 sprays into both nostrils daily.     losartan 100 MG tablet  Commonly known as:  COZAAR  Take 1 tablet (100 mg total) by mouth daily.     multivitamin tablet  Take 1 tablet by mouth daily.     PROBIOTIC ACIDOPHILUS PO  Take by mouth daily.     RABEprazole 20 MG tablet  Commonly known as:  ACIPHEX  Take 1 tablet (20 mg total) by mouth daily.     simvastatin 10 MG tablet  Commonly known as:  ZOCOR  Take 1 tablet (10 mg total) by mouth at bedtime.        Meds ordered this encounter    Medications  . famotidine (PEPCID) 20 MG tablet    Sig: Take 1 tablet (20 mg total) by mouth 2 (two) times daily.    Dispense:  30 tablet    Refill:  3     There is no immunization history on file for this patient.  Family History  Problem Relation Age of Onset  . Heart attack Father   . Heart disease Father     CABG    History  Substance Use Topics  . Smoking status: Former Smoker -- 1.00 packs/day for 17 years    Quit date: 04/12/1992  . Smokeless tobacco: Not on file  . Alcohol Use: Not on file    Review of Systems   As noted in HPI  Filed Vitals:   08/22/13 1411  BP: 144/84  Pulse: 67  Temp: 98.8 F (37.1 C)  Resp: 16    Physical Exam  Physical Exam  Constitutional: No distress.  Eyes: EOM are normal. Pupils are equal, round, and reactive to light.  Cardiovascular: Normal rate and regular rhythm.   Pulmonary/Chest: Breath sounds normal. No  respiratory distress. She has no wheezes. She has no rales.  Abdominal: Soft. There is no tenderness. There is no rebound.  Musculoskeletal: She exhibits no edema.    CBC    Component Value Date/Time   WBC 10.1 09/19/2012 1609   RBC 4.76 09/19/2012 1609   HGB 12.9 09/19/2012 1609   HCT 39.0 09/19/2012 1609   PLT 231 09/19/2012 1609   MCV 81.9 09/19/2012 1609   LYMPHSABS 1.7 07/25/2008 1615   MONOABS 0.3 07/25/2008 1615   EOSABS 0.1 07/25/2008 1615   BASOSABS 0.1 07/25/2008 1615    CMP     Component Value Date/Time   NA 134* 08/15/2013 0935   K 4.4 08/15/2013 0935   CL 100 08/15/2013 0935   CO2 26 08/15/2013 0935   GLUCOSE 90 08/15/2013 0935   BUN 11 08/15/2013 0935   CREATININE 0.91 08/15/2013 0935   CREATININE 0.78 05/15/2012 1755   CALCIUM 8.6 08/15/2013 0935   PROT 6.4 08/15/2013 0935   ALBUMIN 4.0 08/15/2013 0935   AST 23 08/15/2013 0935   ALT 21 08/15/2013 0935   ALKPHOS 91 08/15/2013 0935   BILITOT 0.6 08/15/2013 0935   GFRNONAA 68 08/15/2013 0935   GFRNONAA 89* 05/15/2012 1755   GFRAA 79 08/15/2013 0935   GFRAA >90 05/15/2012 1755     Lab Results  Component Value Date/Time   CHOL 202* 08/15/2013  9:35 AM    No components found with this basename: hga1c    Lab Results  Component Value Date/Time   AST 23 08/15/2013  9:35 AM    Assessment and Plan  DEPRESSION Continue with her Celexa as prescribed.  Dyslipidemia Improved her, diet controlled.   HYPERTENSION Advised for DASH diet continue with losartan 100 mg daily.  Special screening for malignant neoplasms, colon - Plan: Ambulatory referral to Gastroenterology  GERD (gastroesophageal reflux disease) - Plan: Ambulatory referral to Gastroenterology, also started on famotidine (PEPCID) 20 MG tablet, advised for lifestyle modification.  Screening - Plan: [facility] DIGITAL SCREENING BILATERAL   Health Maintenance -Colonoscopy: Referred to GI -Mammogram: Ordered   Return in about 3 months (around 11/22/2013) for hypertension, hyperipidemia.  Lorayne Marek, MD

## 2013-08-22 NOTE — Progress Notes (Signed)
Patient is here for follow up Complains of having an episode where she vomited and it was bright red blood about three weeks ago About a month ago she woke up with her tongue black Would like to be tested for h-pylori

## 2013-08-22 NOTE — Patient Instructions (Signed)
DASH Diet  The DASH diet stands for "Dietary Approaches to Stop Hypertension." It is a healthy eating plan that has been shown to reduce high blood pressure (hypertension) in as little as 14 days, while also possibly providing other significant health benefits. These other health benefits include reducing the risk of breast cancer after menopause and reducing the risk of type 2 diabetes, heart disease, colon cancer, and stroke. Health benefits also include weight loss and slowing kidney failure in patients with chronic kidney disease.   DIET GUIDELINES  · Limit salt (sodium). Your diet should contain less than 1500 mg of sodium daily.  · Limit refined or processed carbohydrates. Your diet should include mostly whole grains. Desserts and added sugars should be used sparingly.  · Include small amounts of heart-healthy fats. These types of fats include nuts, oils, and tub margarine. Limit saturated and trans fats. These fats have been shown to be harmful in the body.  CHOOSING FOODS   The following food groups are based on a 2000 calorie diet. See your Registered Dietitian for individual calorie needs.  Grains and Grain Products (6 to 8 servings daily)  · Eat More Often: Whole-wheat bread, brown rice, whole-grain or wheat pasta, quinoa, popcorn without added fat or salt (air popped).  · Eat Less Often: White bread, white pasta, white rice, cornbread.  Vegetables (4 to 5 servings daily)  · Eat More Often: Fresh, frozen, and canned vegetables. Vegetables may be raw, steamed, roasted, or grilled with a minimal amount of fat.  · Eat Less Often/Avoid: Creamed or fried vegetables. Vegetables in a cheese sauce.  Fruit (4 to 5 servings daily)  · Eat More Often: All fresh, canned (in natural juice), or frozen fruits. Dried fruits without added sugar. One hundred percent fruit juice (½ cup [237 mL] daily).  · Eat Less Often: Dried fruits with added sugar. Canned fruit in light or heavy syrup.  Lean Meats, Fish, and Poultry (2  servings or less daily. One serving is 3 to 4 oz [85-114 g]).  · Eat More Often: Ninety percent or leaner ground beef, tenderloin, sirloin. Round cuts of beef, chicken breast, turkey breast. All fish. Grill, bake, or broil your meat. Nothing should be fried.  · Eat Less Often/Avoid: Fatty cuts of meat, turkey, or chicken leg, thigh, or wing. Fried cuts of meat or fish.  Dairy (2 to 3 servings)  · Eat More Often: Low-fat or fat-free milk, low-fat plain or light yogurt, reduced-fat or part-skim cheese.  · Eat Less Often/Avoid: Milk (whole, 2%). Whole milk yogurt. Full-fat cheeses.  Nuts, Seeds, and Legumes (4 to 5 servings per week)  · Eat More Often: All without added salt.  · Eat Less Often/Avoid: Salted nuts and seeds, canned beans with added salt.  Fats and Sweets (limited)  · Eat More Often: Vegetable oils, tub margarines without trans fats, sugar-free gelatin. Mayonnaise and salad dressings.  · Eat Less Often/Avoid: Coconut oils, palm oils, butter, stick margarine, cream, half and half, cookies, candy, pie.  FOR MORE INFORMATION  The Dash Diet Eating Plan: www.dashdiet.org  Document Released: 03/18/2011 Document Revised: 06/21/2011 Document Reviewed: 03/18/2011  ExitCare® Patient Information ©2014 [employer].

## 2013-09-11 ENCOUNTER — Telehealth: Payer: Self-pay | Admitting: *Deleted

## 2013-09-11 NOTE — Telephone Encounter (Signed)
Patient states she started on Famotidine (Pepcid) 20 mg three days ago and is now having knee pain, headaches, and dry mouth. Patient wants to know if she should continue taking this medication since she having these side effects? Please advise patient. Alverda Skeans, RN

## 2013-09-19 ENCOUNTER — Ambulatory Visit (HOSPITAL_COMMUNITY): Payer: No Typology Code available for payment source

## 2013-10-02 ENCOUNTER — Encounter: Payer: Self-pay | Admitting: Internal Medicine

## 2013-10-04 ENCOUNTER — Other Ambulatory Visit: Payer: Self-pay | Admitting: Emergency Medicine

## 2013-10-04 ENCOUNTER — Ambulatory Visit (HOSPITAL_COMMUNITY)
Admission: RE | Admit: 2013-10-04 | Discharge: 2013-10-04 | Disposition: A | Discharge status: Home / Self Care | Payer: No Typology Code available for payment source | Source: Ambulatory Visit | Attending: Internal Medicine | Admitting: Internal Medicine

## 2013-10-04 ENCOUNTER — Encounter (INDEPENDENT_AMBULATORY_CARE_PROVIDER_SITE_OTHER): Payer: Self-pay

## 2013-10-04 ENCOUNTER — Encounter: Payer: Self-pay | Admitting: Emergency Medicine

## 2013-10-04 DIAGNOSIS — M25561 Pain in right knee: Secondary | ICD-10-CM

## 2013-10-04 DIAGNOSIS — M25569 Pain in unspecified knee: Secondary | ICD-10-CM | POA: Insufficient documentation

## 2013-10-04 MED ORDER — MELOXICAM 15 MG PO TABS: 15.0000 mg | ORAL_TABLET | Freq: Every day | ORAL | Status: DC

## 2013-10-04 NOTE — Telephone Encounter (Signed)
Pt says that she started taking GI meds and three days later started experiencing extreme pain in leg and headaches. Pt has been in contact with Roselyn Reef but did not return call regarding progress until now.

## 2013-10-05 NOTE — Telephone Encounter (Signed)
Pt was seen in clinic 10/04/13 for leg pain and dry mouth. Pt was sent to have xray of knee and prescribed Meloxicam for pain. We will f/u with pt after images reviewed

## 2013-10-15 ENCOUNTER — Telehealth: Payer: Self-pay

## 2013-10-15 NOTE — Telephone Encounter (Signed)
Message copied by Dorothe Pea on Mon Oct 15, 2013  9:46 AM ------      Message from: Angelica Harvey      Created: Wed Oct 10, 2013 11:05 PM       Please inform patient that her knee x-ray is normal ------

## 2013-10-15 NOTE — Telephone Encounter (Signed)
Patient is aware of her x ray results 

## 2013-10-18 ENCOUNTER — Telehealth: Payer: Self-pay

## 2013-10-18 DIAGNOSIS — Z0000 Encounter for general adult medical examination without abnormal findings: Secondary | ICD-10-CM

## 2013-10-18 NOTE — Telephone Encounter (Signed)
Returned patient call Patient still  Complains of pain in her knee Had an xray now requesting MRI Has 100% coverage through [facility] Needs referral to GI

## 2013-10-19 ENCOUNTER — Other Ambulatory Visit: Payer: Self-pay

## 2013-10-19 DIAGNOSIS — M25569 Pain in unspecified knee: Secondary | ICD-10-CM

## 2013-10-22 ENCOUNTER — Telehealth: Payer: Self-pay

## 2013-10-22 NOTE — Telephone Encounter (Signed)
Patient called Was referred to sports medicine and had not yet heard back from them i explained to her it takes a week to ten days to hear back from the specialist Referral was only placed in Epic a few days ago

## 2013-10-23 ENCOUNTER — Telehealth: Payer: Self-pay | Admitting: Internal Medicine

## 2013-10-23 ENCOUNTER — Encounter: Payer: Self-pay | Admitting: Internal Medicine

## 2013-10-23 NOTE — Telephone Encounter (Signed)
Pt returning nurse's call regarding results. Please f/u with pt.

## 2013-10-29 ENCOUNTER — Ambulatory Visit (INDEPENDENT_AMBULATORY_CARE_PROVIDER_SITE_OTHER): Payer: Self-pay | Admitting: Family Medicine

## 2013-10-29 ENCOUNTER — Encounter: Payer: Self-pay | Admitting: Family Medicine

## 2013-10-29 VITALS — BP 136/78 | Ht 62.0 in | Wt 219.0 lb

## 2013-10-29 DIAGNOSIS — M25561 Pain in right knee: Secondary | ICD-10-CM | POA: Insufficient documentation

## 2013-10-29 DIAGNOSIS — M25569 Pain in unspecified knee: Secondary | ICD-10-CM

## 2013-10-29 MED ORDER — METHYLPREDNISOLONE ACETATE 40 MG/ML IJ SUSP
40.0000 mg | Freq: Once | INTRAMUSCULAR | Status: AC
  Administered 2013-10-29: 40 mg via INTRA_ARTICULAR

## 2013-10-29 NOTE — Assessment & Plan Note (Signed)
I think she had an anterior cruciate ligament injury, grade 1. Also think she had a very slight meniscal injury. She's having some meniscal symptoms. We discussed options. I placed her on home exercise program with short arc quadriceps exercises. Icing. For the meniscal injury, we gave her a corticosteroid injection today. Recommend she continue to use the cane as her general level of conditioning is poor. In clinic today she was only able to do 3 repetitions of the short arc quadriceps exercise on her left leg so I told her to her shoe for a maximum of 50 on the injured leg doing 2-3 or 4 at a time. I'll see her back in 3-4 weeks, sooner with problems to

## 2013-10-29 NOTE — Progress Notes (Signed)
Patient ID: Angelica Harvey, female   DOB: 1950-05-03, 63 y.o.   MRN: [id number]  Angelica Harvey - 63 y.o. female MRN [id number]  Date of birth: 10-01-1950    SUBJECTIVE:     Right knee pain  Started after injuryand a half weeks ago; she and her 55 pound dog had a collision in the yard while they were playing. She was standing still when the dog ran into her from the front, hitting her knee with his head. He was running full speed. She didn't really have pain at first but the next day she had a lot of knee pain. Does not think she had a lot of swelling. Has had some stiffness. Since then it has just been sore on the inside part of her knee particularly with a lot of walking. She also has pain at times that  it is sharp and can be reproduced by certain movements. No locking or giving way. ROS:     No fever, sweats, chills, unusual weight change. See history of present illness above for additional pertinent review of systems.  PERTINENT  PMH / PSH FH / / SH:  Past Medical, Surgical, Social, and Family History Reviewed & Updated in the EMR.  Pertinent findings include:  No prior history of knee surgery or injury. Obesity No personal history of diabetes mellitus.   OBJECTIVE: BP 136/78  Ht 5\' 2"  (1.575 m)  Wt 219 lb (99.338 kg)  BMI 40.05 kg/m2  Physical Exam:  Vital signs are reviewed. GENERAL: Well-developed overweight female no acute distress KNEE: Right. Mildly tender to palpation along the insertion  of the MCL and some diffuse tenderness along the medial joint line. There is no bruising, no erythema, no warmth. She has full extension and flexion. The popliteal space is soft. The calf is soft. Ligamentously intact to varus and valgus stress but has a lot of pain with varus testing and this reproduces her knee pain. Anterior drawer negative. McMurray is negative. She is unable to perform Thesaly secondary to generalized deconditioning. She needs some assistance going from a lying position to a seated  position and getting on and off the table. She is walking with cane.  ULTRASOUND: Lateral meniscus is not well seen but there is no notable fluid around it. The medial meniscus as well seen and appears intact but there is some fluid surrounding it. The MCL is ill-defined and there is fluid beneath it. Mild increased Doppler activity at the insertion of the MCL. Quadriceps and patellar tendons are intact. The kneecap has some small amount of superior spurring. There is some debris within Hoffas fat-pad (calcification)   INJECTION: Patient was given informed consent, signed copy in the chart. Appropriate time out was taken. Area prepped and draped in usual sterile fashion. 1 cc of methylprednisolone 40 mg/ml plus  4 cc of 1% lidocaine without epinephrine was injected into the right knee joint using a(n) anterior medial approach. The patient tolerated the procedure well. There were no complications. Post procedure instructions were given.  ASSESSMENT & PLAN:  See problem based charting & AVS for pt instructions.

## 2013-11-14 ENCOUNTER — Ambulatory Visit: Payer: No Typology Code available for payment source | Attending: Internal Medicine

## 2013-11-19 ENCOUNTER — Ambulatory Visit: Payer: Self-pay | Admitting: Family Medicine

## 2013-11-21 ENCOUNTER — Encounter: Payer: Self-pay | Admitting: Internal Medicine

## 2013-11-21 ENCOUNTER — Ambulatory Visit: Payer: No Typology Code available for payment source | Attending: Internal Medicine | Admitting: Internal Medicine

## 2013-11-21 VITALS — BP 139/82 | HR 67 | Temp 98.0°F | Resp 16 | Ht 62.0 in | Wt 221.0 lb

## 2013-11-21 DIAGNOSIS — F329 Major depressive disorder, single episode, unspecified: Secondary | ICD-10-CM

## 2013-11-21 DIAGNOSIS — K219 Gastro-esophageal reflux disease without esophagitis: Secondary | ICD-10-CM

## 2013-11-21 DIAGNOSIS — E785 Hyperlipidemia, unspecified: Secondary | ICD-10-CM | POA: Insufficient documentation

## 2013-11-21 DIAGNOSIS — I10 Essential (primary) hypertension: Secondary | ICD-10-CM

## 2013-11-21 DIAGNOSIS — Z87891 Personal history of nicotine dependence: Secondary | ICD-10-CM | POA: Insufficient documentation

## 2013-11-21 DIAGNOSIS — F3289 Other specified depressive episodes: Secondary | ICD-10-CM

## 2013-11-21 LAB — COMPLETE METABOLIC PANEL WITH GFR
ALT: 19 U/L (ref 0–35)
AST: 17 U/L (ref 0–37)
Albumin: 4.2 g/dL (ref 3.5–5.2)
Alkaline Phosphatase: 93 U/L (ref 39–117)
BILIRUBIN TOTAL: 0.5 mg/dL (ref 0.2–1.2)
BUN: 13 mg/dL (ref 6–23)
CO2: 25 mEq/L (ref 19–32)
CREATININE: 0.9 mg/dL (ref 0.50–1.10)
Calcium: 8.9 mg/dL (ref 8.4–10.5)
Chloride: 102 mEq/L (ref 96–112)
GFR, EST AFRICAN AMERICAN: 79 mL/min
GFR, Est Non African American: 69 mL/min
Glucose, Bld: 92 mg/dL (ref 70–99)
Potassium: 4.8 mEq/L (ref 3.5–5.3)
SODIUM: 139 meq/L (ref 135–145)
Total Protein: 6.8 g/dL (ref 6.0–8.3)

## 2013-11-21 MED ORDER — LOSARTAN POTASSIUM 100 MG PO TABS: 100.0000 mg | ORAL_TABLET | Freq: Every day | ORAL | Status: DC

## 2013-11-21 NOTE — Progress Notes (Signed)
Pt is here today following up on her HTN. Pt recently has been diagnosed with GERD.

## 2013-11-21 NOTE — Patient Instructions (Signed)
DASH Eating Plan °DASH stands for "Dietary Approaches to Stop Hypertension." The DASH eating plan is a healthy eating plan that has been shown to reduce high blood pressure (hypertension). Additional health benefits may include reducing the risk of type 2 diabetes mellitus, heart disease, and stroke. The DASH eating plan may also help with weight loss. °WHAT DO I NEED TO KNOW ABOUT THE DASH EATING PLAN? °For the DASH eating plan, you will follow these general guidelines: °· Choose foods with a percent daily value for sodium of less than 5% (as listed on the food label). °· Use salt-free seasonings or herbs instead of table salt or sea salt. °· Check with your health care provider or pharmacist before using salt substitutes. °· Eat lower-sodium products, often labeled as "lower sodium" or "no salt added." °· Eat fresh foods. °· Eat more vegetables, fruits, and low-fat dairy products. °· Choose whole grains. Look for the word "whole" as the first word in the ingredient list. °· Choose fish and skinless chicken or turkey more often than red meat. Limit fish, poultry, and meat to 6 oz (170 g) each day. °· Limit sweets, desserts, sugars, and sugary drinks. °· Choose heart-healthy fats. °· Limit cheese to 1 oz (28 g) per day. °· Eat more home-cooked food and less restaurant, buffet, and fast food. °· Limit fried foods. °· Cook foods using methods other than frying. °· Limit canned vegetables. If you do use them, rinse them well to decrease the sodium. °· When eating at a restaurant, ask that your food be prepared with less salt, or no salt if possible. °WHAT FOODS CAN I EAT? °Seek help from a dietitian for individual calorie needs. °Grains °Whole grain or whole wheat bread. Brown rice. Whole grain or whole wheat pasta. Quinoa, bulgur, and whole grain cereals. Low-sodium cereals. Corn or whole wheat flour tortillas. Whole grain cornbread. Whole grain crackers. Low-sodium crackers. °Vegetables °Fresh or frozen vegetables  (raw, steamed, roasted, or grilled). Low-sodium or reduced-sodium tomato and vegetable juices. Low-sodium or reduced-sodium tomato sauce and paste. Low-sodium or reduced-sodium canned vegetables.  °Fruits °All fresh, canned (in natural juice), or frozen fruits. °Meat and Other Protein Products °Ground beef (85% or leaner), grass-fed beef, or beef trimmed of fat. Skinless chicken or turkey. Ground chicken or turkey. Pork trimmed of fat. All fish and seafood. Eggs. Dried beans, peas, or lentils. Unsalted nuts and seeds. Unsalted canned beans. °Dairy °Low-fat dairy products, such as skim or 1% milk, 2% or reduced-fat cheeses, low-fat ricotta or cottage cheese, or plain low-fat yogurt. Low-sodium or reduced-sodium cheeses. °Fats and Oils °Tub margarines without trans fats. Light or reduced-fat mayonnaise and salad dressings (reduced sodium). Avocado. Safflower, olive, or canola oils. Natural peanut or almond butter. °Other °Unsalted popcorn and pretzels. °The items listed above may not be a complete list of recommended foods or beverages. Contact your dietitian for more options. °WHAT FOODS ARE NOT RECOMMENDED? °Grains °White bread. White pasta. White rice. Refined cornbread. Bagels and croissants. Crackers that contain trans fat. °Vegetables °Creamed or fried vegetables. Vegetables in a cheese sauce. Regular canned vegetables. Regular canned tomato sauce and paste. Regular tomato and vegetable juices. °Fruits °Dried fruits. Canned fruit in light or heavy syrup. Fruit juice. °Meat and Other Protein Products °Fatty cuts of meat. Ribs, chicken wings, bacon, sausage, bologna, salami, chitterlings, fatback, hot dogs, bratwurst, and packaged luncheon meats. Salted nuts and seeds. Canned beans with salt. °Dairy °Whole or 2% milk, cream, half-and-half, and cream cheese. Whole-fat or sweetened yogurt. Full-fat   cheeses or blue cheese. Nondairy creamers and whipped toppings. Processed cheese, cheese spreads, or cheese  curds. °Condiments °Onion and garlic salt, seasoned salt, table salt, and sea salt. Canned and packaged gravies. Worcestershire sauce. Tartar sauce. Barbecue sauce. Teriyaki sauce. Soy sauce, including reduced sodium. Steak sauce. Fish sauce. Oyster sauce. Cocktail sauce. Horseradish. Ketchup and mustard. Meat flavorings and tenderizers. Bouillon cubes. Hot sauce. Tabasco sauce. Marinades. Taco seasonings. Relishes. °Fats and Oils °Butter, stick margarine, lard, shortening, ghee, and bacon fat. Coconut, palm kernel, or palm oils. Regular salad dressings. °Other °Pickles and olives. Salted popcorn and pretzels. °The items listed above may not be a complete list of foods and beverages to avoid. Contact your dietitian for more information. °WHERE CAN I FIND MORE INFORMATION? °[employer], [employer], [employer]: www.nhlbi.nih.gov/health/health-topics/topics/dash/ °Document Released: 03/18/2011 Document Revised: 08/13/2013 Document Reviewed: 01/31/2013 °[employer]® Patient Information ©2015 [employer]. This information is not intended to replace advice given to you by your health care provider. Make sure you discuss any questions you have with your health care provider. ° °

## 2013-11-21 NOTE — Progress Notes (Signed)
MRN: [id number] Name: Angelica Harvey  Sex: female Age: 63 y.o. DOB: May 07, 1950  Allergies: Belladonna; Hctz; Phenobarbital; and Quinine  Chief Complaint  Patient presents with  . Follow-up    HPI: Patient is 63 y.o. female who has to of hypertension hyperlipidemia, depression, GERD comes today for followup, she denies any acute symptoms her blood pressure is borderline elevated denies any headache dizziness chest and shortness of breath, also history of knee pain and following up with her sports medicine, as per patient she was given a steroid injection and she feels much better. Patient had a blood work done which was reviewed with her.  Past Medical History  Diagnosis Date  . Dyslipidemia   . Depression   . Hypertension   . History of hypokalemia   . Hypercholesterolemia     Past Surgical History  Procedure Laterality Date  . Cesarean section        Medication List       This list is accurate as of: 11/21/13  2:42 PM.  Always use your most recent med list.               benzonatate 100 MG capsule  Commonly known as:  TESSALON  Take 1 capsule (100 mg total) by mouth 3 (three) times daily as needed for cough.     citalopram 20 MG tablet  Commonly known as:  CELEXA  Take 2 tablets (40 mg total) by mouth daily.     famotidine 20 MG tablet  Commonly known as:  PEPCID  Take 1 tablet (20 mg total) by mouth 2 (two) times daily.     fluticasone 50 MCG/ACT nasal spray  Commonly known as:  FLONASE  Place 2 sprays into both nostrils daily.     losartan 100 MG tablet  Commonly known as:  COZAAR  Take 1 tablet (100 mg total) by mouth daily.     meloxicam 15 MG tablet  Commonly known as:  MOBIC  Take 1 tablet (15 mg total) by mouth daily.     multivitamin tablet  Take 1 tablet by mouth daily.     PROBIOTIC ACIDOPHILUS PO  Take by mouth daily.     RABEprazole 20 MG tablet  Commonly known as:  ACIPHEX  Take 1 tablet (20 mg total) by mouth daily.     simvastatin 10 MG tablet  Commonly known as:  ZOCOR  Take 1 tablet (10 mg total) by mouth at bedtime.        Meds ordered this encounter  Medications  . losartan (COZAAR) 100 MG tablet    Sig: Take 1 tablet (100 mg total) by mouth daily.    Dispense:  30 tablet    Refill:  5     There is no immunization history on file for this patient.  Family History  Problem Relation Age of Onset  . Heart attack Father   . Heart disease Father     CABG    History  Substance Use Topics  . Smoking status: Former Smoker -- 1.00 packs/day for 17 years    Quit date: 04/12/1992  . Smokeless tobacco: Not on file  . Alcohol Use: Not on file    Review of Systems   As noted in HPI  Filed Vitals:   11/21/13 1426  BP: 139/82  Pulse: 67  Temp: 98 F (36.7 C)  Resp: 16    Physical Exam  Physical Exam  Constitutional: No distress.  Eyes: EOM are normal.  Pupils are equal, round, and reactive to light.  Cardiovascular: Normal rate and regular rhythm.   Pulmonary/Chest: Breath sounds normal. No respiratory distress. She has no wheezes. She has no rales.  Musculoskeletal: She exhibits no edema.    CBC    Component Value Date/Time   WBC 10.1 09/19/2012 1609   RBC 4.76 09/19/2012 1609   HGB 12.9 09/19/2012 1609   HCT 39.0 09/19/2012 1609   PLT 231 09/19/2012 1609   MCV 81.9 09/19/2012 1609   LYMPHSABS 1.7 07/25/2008 1615   MONOABS 0.3 07/25/2008 1615   EOSABS 0.1 07/25/2008 1615   BASOSABS 0.1 07/25/2008 1615    CMP     Component Value Date/Time   NA 134* 08/15/2013 0935   K 4.4 08/15/2013 0935   CL 100 08/15/2013 0935   CO2 26 08/15/2013 0935   GLUCOSE 90 08/15/2013 0935   BUN 11 08/15/2013 0935   CREATININE 0.91 08/15/2013 0935   CREATININE 0.78 05/15/2012 1755   CALCIUM 8.6 08/15/2013 0935   PROT 6.4 08/15/2013 0935   ALBUMIN 4.0 08/15/2013 0935   AST 23 08/15/2013 0935   ALT 21 08/15/2013 0935   ALKPHOS 91 08/15/2013 0935   BILITOT 0.6 08/15/2013 0935   GFRNONAA 68 08/15/2013 0935   GFRNONAA 89*  05/15/2012 1755   GFRAA 79 08/15/2013 0935   GFRAA >90 05/15/2012 1755    Lab Results  Component Value Date/Time   CHOL 202* 08/15/2013  9:35 AM    No components found with this basename: hga1c    Lab Results  Component Value Date/Time   AST 23 08/15/2013  9:35 AM    Assessment and Plan  Unspecified essential hypertension - Plan: Advised patient for DASH diet, continue with her losartan (COZAAR) 100 MG tablet, will repeat blood chemistry COMPLETE METABOLIC PANEL WITH GFR  Gastroesophageal reflux disease, esophagitis presence not specified Continue with lifestyle modification currently on Pepcid.  Depressive disorder, not elsewhere classified Symptoms are stable Patient is on Celexa, denies any SI or HI  Health Maintenance -Colonoscopy: scheduled with GI   Return in about 3 months (around 02/21/2014) for hypertension.  Lorayne Marek, MD

## 2013-11-22 ENCOUNTER — Telehealth: Payer: Self-pay | Admitting: Emergency Medicine

## 2013-11-22 NOTE — Telephone Encounter (Signed)
Message copied by Ricci Barker on Thu Nov 22, 2013 11:02 AM ------      Message from: Lorayne Marek      Created: Thu Nov 22, 2013 10:22 AM       Call and let the Patient know that blood work is normal.       ------

## 2013-11-22 NOTE — Telephone Encounter (Signed)
Left message with normal lab results.

## 2013-12-10 ENCOUNTER — Ambulatory Visit: Payer: No Typology Code available for payment source | Admitting: Family Medicine

## 2013-12-28 ENCOUNTER — Ambulatory Visit (INDEPENDENT_AMBULATORY_CARE_PROVIDER_SITE_OTHER)
Admission: RE | Admit: 2013-12-28 | Discharge: 2013-12-28 | Disposition: A | Discharge status: Home / Self Care | Payer: No Typology Code available for payment source | Source: Ambulatory Visit | Attending: Internal Medicine | Admitting: Internal Medicine

## 2013-12-28 ENCOUNTER — Encounter: Payer: Self-pay | Admitting: Internal Medicine

## 2013-12-28 ENCOUNTER — Ambulatory Visit (INDEPENDENT_AMBULATORY_CARE_PROVIDER_SITE_OTHER): Payer: Self-pay | Admitting: Internal Medicine

## 2013-12-28 VITALS — BP 140/74 | HR 64 | Ht 62.75 in | Wt 225.1 lb

## 2013-12-28 DIAGNOSIS — R059 Cough, unspecified: Secondary | ICD-10-CM

## 2013-12-28 DIAGNOSIS — Z1211 Encounter for screening for malignant neoplasm of colon: Secondary | ICD-10-CM

## 2013-12-28 DIAGNOSIS — R05 Cough: Secondary | ICD-10-CM

## 2013-12-28 DIAGNOSIS — K219 Gastro-esophageal reflux disease without esophagitis: Secondary | ICD-10-CM

## 2013-12-28 MED ORDER — PANTOPRAZOLE SODIUM 40 MG PO TBEC: 40.0000 mg | DELAYED_RELEASE_TABLET | Freq: Every day | ORAL | Status: DC

## 2013-12-28 NOTE — Patient Instructions (Signed)
Please go directly to the basement in the x-ray department for your chest x-ray.   We have sent the following medications to your pharmacy for you to pick up at your convenience: pantoprazole.  Discontinue famotidine.   You have been given a GERD diet.   cc: Lorayne Marek, MD

## 2013-12-28 NOTE — Progress Notes (Signed)
Patient ID: Angelica Harvey, female   DOB: 1950-04-18, 63 y.o.   MRN: [id number] HPI: Angelica Harvey is a 63 year old female with a past medical history of GERD, hypertension, hyperlipidemia, depression, obesity who is seen in consultation at the request of Dr. Annitta Needs to evaluate cough and GERD. She reports her last year she's had on and off cough. She has a history of heartburn which he considers mild. Heartburn is worsened by foods such as chocolate and peppermint. Her cough is nonproductive and intermittent. It is not necessarily worse after eating or at night. Is not associated with shortness of breath or chest pain. In the past she has had chest pain and reportedly had a negative cardiology evaluation. Her heartburn occurs 2-3 days per week and she has been using TUMS. She has a prescription for famotidine but she states she is not taking it. She does not recall prior PPI therapy. She does endorse throat clearing but denies globus, dysphagia or diet dysphagia. She also denies nausea, vomiting, and abdominal pain. She has gained weight and reports a good appetite. She reports gaining 2 pounds despite being on a diet which made her more frustrated. She reports regular bowel movements without blood in her stool or melena. She had a colonoscopy in 2007 performed by Dr. Olevia Perches which revealed a diminutive hyperplastic polyp only. She has a family history of colon cancer in a maternal aunt, but no first-degree relative  Past Medical History  Diagnosis Date  . Depression   . Hypertension   . History of hypokalemia   . Hypercholesterolemia   . Obesity   . Pneumonia   . GERD (gastroesophageal reflux disease)   . Anxiety   . Colon polyps     Past Surgical History  Procedure Laterality Date  . Cesarean section      Outpatient Prescriptions Prior to Visit  Medication Sig Dispense Refill  . citalopram (CELEXA) 20 MG tablet Take 2 tablets (40 mg total) by mouth daily.  30 tablet  6  . famotidine (PEPCID) 20  MG tablet Take 1 tablet (20 mg total) by mouth 2 (two) times daily.  30 tablet  3  . Lactobacillus (PROBIOTIC ACIDOPHILUS PO) Take by mouth daily.      Marland Kitchen losartan (COZAAR) 100 MG tablet Take 1 tablet (100 mg total) by mouth daily.  30 tablet  5  . Multiple Vitamin (MULTIVITAMIN) tablet Take 1 tablet by mouth daily.      . meloxicam (MOBIC) 15 MG tablet Take 1 tablet (15 mg total) by mouth daily.  30 tablet  0  . benzonatate (TESSALON) 100 MG capsule Take 1 capsule (100 mg total) by mouth 3 (three) times daily as needed for cough.  30 capsule  1  . fluticasone (FLONASE) 50 MCG/ACT nasal spray Place 2 sprays into both nostrils daily.  16 g  1  . RABEprazole (ACIPHEX) 20 MG tablet Take 1 tablet (20 mg total) by mouth daily.      . simvastatin (ZOCOR) 10 MG tablet Take 1 tablet (10 mg total) by mouth at bedtime.  30 tablet  3   No facility-administered medications prior to visit.    Allergies  Allergen Reactions  . Belladonna     REACTION: rash  . Hctz [Hydrochlorothiazide]   . Phenobarbital     REACTION: rash  . Quinine     REACTION: rash    Family History  Problem Relation Age of Onset  . Heart attack Father   . Heart disease Father  CABG  . Colon cancer Maternal Aunt     x 2  . Colon polyps Mother   . Kidney disease Sister     dialysis    History  Substance Use Topics  . Smoking status: Former Smoker -- 1.00 packs/day for 17 years    Types: Cigarettes    Quit date: 04/12/1990  . Smokeless tobacco: Never Used  . Alcohol Use: Yes     Comment: ocassional    ROS: As per history of present illness, otherwise negative  BP 140/74  Pulse 64  Ht 5' 2.75" (1.594 m)  Wt 225 lb 2 oz (102.116 kg)  BMI 40.19 kg/m2 Constitutional: Well-developed and well-nourished. No distress. HEENT: Normocephalic and atraumatic. Oropharynx is clear and moist. No oropharyngeal exudate. Conjunctivae are normal.  No scleral icterus. Cardiovascular: Normal rate, regular rhythm and intact  distal pulses. No M/R/G Pulmonary/chest: Effort normal and breath sounds normal. No wheezing, rales or rhonchi. Abdominal: Soft, obese, nontender, nondistended. Bowel sounds active throughout. T Extremities: no clubbing, cyanosis, or edema Lymphadenopathy: No cervical adenopathy noted. Neurological: Alert and oriented to person place and time. Psychiatric: Normal mood and affect. Behavior is normal.  RELEVANT LABS AND IMAGING: CBC    Component Value Date/Time   WBC 10.1 09/19/2012 1609   RBC 4.76 09/19/2012 1609   HGB 12.9 09/19/2012 1609   HCT 39.0 09/19/2012 1609   PLT 231 09/19/2012 1609   MCV 81.9 09/19/2012 1609   MCH 27.1 09/19/2012 1609   MCHC 33.1 09/19/2012 1609   RDW 14.4 09/19/2012 1609   LYMPHSABS 1.7 07/25/2008 1615   MONOABS 0.3 07/25/2008 1615   EOSABS 0.1 07/25/2008 1615   BASOSABS 0.1 07/25/2008 1615    CMP     Component Value Date/Time   NA 139 11/21/2013 1447   K 4.8 11/21/2013 1447   CL 102 11/21/2013 1447   CO2 25 11/21/2013 1447   GLUCOSE 92 11/21/2013 1447   BUN 13 11/21/2013 1447   CREATININE 0.90 11/21/2013 1447   CREATININE 0.78 05/15/2012 1755   CALCIUM 8.9 11/21/2013 1447   PROT 6.8 11/21/2013 1447   ALBUMIN 4.2 11/21/2013 1447   AST 17 11/21/2013 1447   ALT 19 11/21/2013 1447   ALKPHOS 93 11/21/2013 1447   BILITOT 0.5 11/21/2013 1447   GFRNONAA 69 11/21/2013 1447   GFRNONAA 89* 05/15/2012 1755   GFRAA 79 11/21/2013 1447   GFRAA >90 05/15/2012 1755   Lab Results  Component Value Date   TSH 1.655 05/15/2012     ASSESSMENT/PLAN: 63 year old female with a past medical history of GERD, hypertension, hyperlipidemia, depression, obesity who is seen in consultation at the request of Dr. Annitta Needs to evaluate cough and GERD.  1. GERD with cough -- we discussed GERD and how it can relate to cough. Cough is an atypical symptom for GERD but is possible. Certainly there are multiple other causes for cough. Chest x-ray today. We discussed management which ideally would be  nonpharmacologic including following a GERD diet and attempting to lose weight. Weight loss can dramatically improve heartburn and reflux. In order to determine association regarding heartburn/reflux and cough, I'm going to start pantoprazole 40 mg daily. She is instructed to take this 30 minutes before her first meal of the day. There are no alarm symptoms to warrant upper endoscopy at this time. I'll have her return in 8-12 weeks for reassessment. She will monitor her heartburn and cough after starting pantoprazole. Further testing can be performed if the question of is GERD  associated with her cough remains, such as impedance testing, but will defer this for now. She is happy with this plan  2.  CRC screening -- hyperplastic colon polyp removed in 2007. Repeat colonoscopy recommended 10 years after last exam which would be May 2017.

## 2014-01-29 ENCOUNTER — Ambulatory Visit: Payer: No Typology Code available for payment source | Attending: Internal Medicine | Admitting: Internal Medicine

## 2014-01-29 ENCOUNTER — Encounter: Payer: Self-pay | Admitting: Internal Medicine

## 2014-01-29 VITALS — BP 158/79 | HR 69 | Temp 98.0°F | Resp 16 | Wt 224.2 lb

## 2014-01-29 DIAGNOSIS — Z2821 Immunization not carried out because of patient refusal: Secondary | ICD-10-CM | POA: Insufficient documentation

## 2014-01-29 DIAGNOSIS — Z87891 Personal history of nicotine dependence: Secondary | ICD-10-CM | POA: Insufficient documentation

## 2014-01-29 DIAGNOSIS — F329 Major depressive disorder, single episode, unspecified: Secondary | ICD-10-CM | POA: Insufficient documentation

## 2014-01-29 DIAGNOSIS — I10 Essential (primary) hypertension: Secondary | ICD-10-CM

## 2014-01-29 DIAGNOSIS — K219 Gastro-esophageal reflux disease without esophagitis: Secondary | ICD-10-CM

## 2014-01-29 NOTE — Patient Instructions (Signed)
DASH Eating Plan °DASH stands for "Dietary Approaches to Stop Hypertension." The DASH eating plan is a healthy eating plan that has been shown to reduce high blood pressure (hypertension). Additional health benefits may include reducing the risk of type 2 diabetes mellitus, heart disease, and stroke. The DASH eating plan may also help with weight loss. °WHAT DO I NEED TO KNOW ABOUT THE DASH EATING PLAN? °For the DASH eating plan, you will follow these general guidelines: °· Choose foods with a percent daily value for sodium of less than 5% (as listed on the food label). °· Use salt-free seasonings or herbs instead of table salt or sea salt. °· Check with your health care provider or pharmacist before using salt substitutes. °· Eat lower-sodium products, often labeled as "lower sodium" or "no salt added." °· Eat fresh foods. °· Eat more vegetables, fruits, and low-fat dairy products. °· Choose whole grains. Look for the word "whole" as the first word in the ingredient list. °· Choose fish and skinless chicken or turkey more often than red meat. Limit fish, poultry, and meat to 6 oz (170 g) each day. °· Limit sweets, desserts, sugars, and sugary drinks. °· Choose heart-healthy fats. °· Limit cheese to 1 oz (28 g) per day. °· Eat more home-cooked food and less restaurant, buffet, and fast food. °· Limit fried foods. °· Cook foods using methods other than frying. °· Limit canned vegetables. If you do use them, rinse them well to decrease the sodium. °· When eating at a restaurant, ask that your food be prepared with less salt, or no salt if possible. °WHAT FOODS CAN I EAT? °Seek help from a dietitian for individual calorie needs. °Grains °Whole grain or whole wheat bread. Brown rice. Whole grain or whole wheat pasta. Quinoa, bulgur, and whole grain cereals. Low-sodium cereals. Corn or whole wheat flour tortillas. Whole grain cornbread. Whole grain crackers. Low-sodium crackers. °Vegetables °Fresh or frozen vegetables  (raw, steamed, roasted, or grilled). Low-sodium or reduced-sodium tomato and vegetable juices. Low-sodium or reduced-sodium tomato sauce and paste. Low-sodium or reduced-sodium canned vegetables.  °Fruits °All fresh, canned (in natural juice), or frozen fruits. °Meat and Other Protein Products °Ground beef (85% or leaner), grass-fed beef, or beef trimmed of fat. Skinless chicken or turkey. Ground chicken or turkey. Pork trimmed of fat. All fish and seafood. Eggs. Dried beans, peas, or lentils. Unsalted nuts and seeds. Unsalted canned beans. °Dairy °Low-fat dairy products, such as skim or 1% milk, 2% or reduced-fat cheeses, low-fat ricotta or cottage cheese, or plain low-fat yogurt. Low-sodium or reduced-sodium cheeses. °Fats and Oils °Tub margarines without trans fats. Light or reduced-fat mayonnaise and salad dressings (reduced sodium). Avocado. Safflower, olive, or canola oils. Natural peanut or almond butter. °Other °Unsalted popcorn and pretzels. °The items listed above may not be a complete list of recommended foods or beverages. Contact your dietitian for more options. °WHAT FOODS ARE NOT RECOMMENDED? °Grains °White bread. White pasta. White rice. Refined cornbread. Bagels and croissants. Crackers that contain trans fat. °Vegetables °Creamed or fried vegetables. Vegetables in a cheese sauce. Regular canned vegetables. Regular canned tomato sauce and paste. Regular tomato and vegetable juices. °Fruits °Dried fruits. Canned fruit in light or heavy syrup. Fruit juice. °Meat and Other Protein Products °Fatty cuts of meat. Ribs, chicken wings, bacon, sausage, bologna, salami, chitterlings, fatback, hot dogs, bratwurst, and packaged luncheon meats. Salted nuts and seeds. Canned beans with salt. °Dairy °Whole or 2% milk, cream, half-and-half, and cream cheese. Whole-fat or sweetened yogurt. Full-fat   cheeses or blue cheese. Nondairy creamers and whipped toppings. Processed cheese, cheese spreads, or cheese  curds. °Condiments °Onion and garlic salt, seasoned salt, table salt, and sea salt. Canned and packaged gravies. Worcestershire sauce. Tartar sauce. Barbecue sauce. Teriyaki sauce. Soy sauce, including reduced sodium. Steak sauce. Fish sauce. Oyster sauce. Cocktail sauce. Horseradish. Ketchup and mustard. Meat flavorings and tenderizers. Bouillon cubes. Hot sauce. Tabasco sauce. Marinades. Taco seasonings. Relishes. °Fats and Oils °Butter, stick margarine, lard, shortening, ghee, and bacon fat. Coconut, palm kernel, or palm oils. Regular salad dressings. °Other °Pickles and olives. Salted popcorn and pretzels. °The items listed above may not be a complete list of foods and beverages to avoid. Contact your dietitian for more information. °WHERE CAN I FIND MORE INFORMATION? °[employer], [employer], [employer]: www.nhlbi.nih.gov/health/health-topics/topics/dash/ °Document Released: 03/18/2011 Document Revised: 08/13/2013 Document Reviewed: 01/31/2013 °[employer]® Patient Information ©2015 [employer]. This information is not intended to replace advice given to you by your health care provider. Make sure you discuss any questions you have with your health care provider. ° °

## 2014-01-29 NOTE — Progress Notes (Signed)
MRN: [id number] Name: Angelica Harvey  Sex: female Age: 63 y.o. DOB: [birth year]  Allergies: Belladonna; Hctz; Phenobarbital; and Quinine  Chief Complaint  Patient presents with  . Follow-up    HPI: Patient is 63 y.o. female who has to of hypertension, GERD her, as per patient she saw a GI specialist and was started on Protonix last month she feels much better and her symptoms are improved, she denies any symptoms of dyspepsia, today her blood pressure is elevated denies any headache dizziness chest and shortness of breath, history of depression currently on Celexa denies any SI or HI.  Past Medical History  Diagnosis Date  . Depression   . Hypertension   . History of hypokalemia   . Hypercholesterolemia   . Obesity   . Pneumonia   . GERD (gastroesophageal reflux disease)   . Anxiety   . Colon polyps     Past Surgical History  Procedure Laterality Date  . Cesarean section        Medication List       This list is accurate as of: 01/29/14  3:05 PM.  Always use your most recent med list.               citalopram 20 MG tablet  Commonly known as:  CELEXA  Take 2 tablets (40 mg total) by mouth daily.     famotidine 20 MG tablet  Commonly known as:  PEPCID  Take 1 tablet (20 mg total) by mouth 2 (two) times daily.     losartan 100 MG tablet  Commonly known as:  COZAAR  Take 1 tablet (100 mg total) by mouth daily.     meloxicam 15 MG tablet  Commonly known as:  MOBIC  Take 15 mg by mouth as needed.     multivitamin tablet  Take 1 tablet by mouth daily.     pantoprazole 40 MG tablet  Commonly known as:  PROTONIX  Take 1 tablet (40 mg total) by mouth daily.     PROBIOTIC ACIDOPHILUS PO  Take by mouth daily.        No orders of the defined types were placed in this encounter.     There is no immunization history on file for this patient.  Family History  Problem Relation Age of Onset  . Heart attack Father   . Heart disease Father     CABG  .  Colon cancer Maternal Aunt     x 2  . Colon polyps Mother   . Kidney disease Sister     dialysis    History  Substance Use Topics  . Smoking status: Former Smoker -- 1.00 packs/day for 17 years    Types: Cigarettes    Quit date: 04/12/1990  . Smokeless tobacco: Never Used  . Alcohol Use: Yes     Comment: ocassional    Review of Systems   As noted in HPI  Filed Vitals:   01/29/14 1423  BP: 158/79  Pulse: 69  Temp: 98 F (36.7 C)  Resp: 16    Physical Exam  Physical Exam  Constitutional: No distress.  Eyes: EOM are normal. Pupils are equal, round, and reactive to light.  Cardiovascular: Normal rate and regular rhythm.   Pulmonary/Chest: Breath sounds normal. No respiratory distress. She has no wheezes. She has no rales.  Abdominal: Soft. There is no tenderness. There is no rebound.  Musculoskeletal: She exhibits no edema.    CBC    Component Value  Date/Time   WBC 10.1 09/19/2012 1609   RBC 4.76 09/19/2012 1609   HGB 12.9 09/19/2012 1609   HCT 39.0 09/19/2012 1609   PLT 231 09/19/2012 1609   MCV 81.9 09/19/2012 1609   LYMPHSABS 1.7 07/25/2008 1615   MONOABS 0.3 07/25/2008 1615   EOSABS 0.1 07/25/2008 1615   BASOSABS 0.1 07/25/2008 1615    CMP     Component Value Date/Time   NA 139 11/21/2013 1447   K 4.8 11/21/2013 1447   CL 102 11/21/2013 1447   CO2 25 11/21/2013 1447   GLUCOSE 92 11/21/2013 1447   BUN 13 11/21/2013 1447   CREATININE 0.90 11/21/2013 1447   CREATININE 0.78 05/15/2012 1755   CALCIUM 8.9 11/21/2013 1447   PROT 6.8 11/21/2013 1447   ALBUMIN 4.2 11/21/2013 1447   AST 17 11/21/2013 1447   ALT 19 11/21/2013 1447   ALKPHOS 93 11/21/2013 1447   BILITOT 0.5 11/21/2013 1447   GFRNONAA 69 11/21/2013 1447   GFRNONAA 89* 05/15/2012 1755   GFRAA 79 11/21/2013 1447   GFRAA >90 05/15/2012 1755    Lab Results  Component Value Date/Time   CHOL 202* 08/15/2013  9:35 AM    No components found with this basename: hga1c    Lab Results  Component Value Date/Time   AST  17 11/21/2013  2:47 PM    Assessment and Plan  Gastroesophageal reflux disease, esophagitis presence not specified Encouraged for last modification as well as continue with Protonix.  Essential hypertension Patient will continue with her current dose of losartan again emphasized on diet modification.  Health Maintenance -Colonoscopy: uptodate   -Mammogram: patient will schedule  -Vaccinations:   Patient declines flu shot   Return in about 3 months (around 05/01/2014) for hypertension.  Lorayne Marek, MD

## 2014-01-29 NOTE — Progress Notes (Signed)
Patient states was seen by the GI dr recently Patient is concerned she might have h pylori She researched things on the computer and states gerd can sometimes cause you to have H pylori

## 2014-02-01 ENCOUNTER — Ambulatory Visit: Payer: No Typology Code available for payment source | Admitting: Family Medicine

## 2014-03-14 ENCOUNTER — Other Ambulatory Visit: Payer: Self-pay | Admitting: Internal Medicine

## 2014-03-14 ENCOUNTER — Ambulatory Visit (HOSPITAL_COMMUNITY)
Admission: RE | Admit: 2014-03-14 | Discharge: 2014-03-14 | Disposition: A | Discharge status: Home / Self Care | Payer: No Typology Code available for payment source | Source: Ambulatory Visit | Attending: Internal Medicine | Admitting: Internal Medicine

## 2014-03-14 DIAGNOSIS — Z139 Encounter for screening, unspecified: Secondary | ICD-10-CM

## 2014-03-14 DIAGNOSIS — Z1231 Encounter for screening mammogram for malignant neoplasm of breast: Secondary | ICD-10-CM

## 2014-03-20 ENCOUNTER — Ambulatory Visit: Payer: No Typology Code available for payment source | Attending: Internal Medicine

## 2014-04-18 ENCOUNTER — Telehealth: Payer: Self-pay | Admitting: Internal Medicine

## 2014-05-13 ENCOUNTER — Other Ambulatory Visit: Payer: Self-pay | Admitting: Internal Medicine

## 2014-06-04 ENCOUNTER — Ambulatory Visit: Payer: Self-pay | Attending: Internal Medicine

## 2014-06-25 ENCOUNTER — Telehealth: Payer: Self-pay | Admitting: Internal Medicine

## 2014-06-25 NOTE — Telephone Encounter (Signed)
The pt left a voicemail on ext [phone] asking about needing an ortho referral. I LMTCB to get more info from the pt. I advised her to ask to speak with Langley Gauss. Please send returned call to [facility]

## 2014-06-28 ENCOUNTER — Ambulatory Visit (INDEPENDENT_AMBULATORY_CARE_PROVIDER_SITE_OTHER): Payer: Self-pay | Admitting: Family Medicine

## 2014-06-28 ENCOUNTER — Encounter: Payer: Self-pay | Admitting: Family Medicine

## 2014-06-28 VITALS — BP 156/73 | Ht 63.0 in | Wt 226.0 lb

## 2014-06-28 DIAGNOSIS — M25561 Pain in right knee: Secondary | ICD-10-CM

## 2014-06-28 MED ORDER — METHYLPREDNISOLONE ACETATE 40 MG/ML IJ SUSP
40.0000 mg | Freq: Once | INTRAMUSCULAR | Status: AC
  Administered 2014-06-28: 40 mg via INTRA_ARTICULAR

## 2014-06-28 NOTE — Progress Notes (Signed)
   Subjective:    Patient ID: Angelica Harvey, female    DOB: [birth year], 64 y.o.   MRN: [id number]  HPI  Right knee pain. I had seen her back in the summer after a contusion to her knee by her 55 pound dog. At that time we gave her corticosteroid injection which significantly improved her pain. She was doing quite well until about 2 or 3 weeks ago when she started doing more walking to lose weight. Since then she's noted some aching pain particularly in the evening after she's been up doing a lot of activity. She's not had any swelling. No calf pain. Is bothering her enough that she is afraid she'll be unable to continue her walking program. She's also noticed a couple of episodes of catching or locking which is associated with brief intense increase in her pain.  Review of Systems No unusual weight change although she is actively trying to lose weight. She's noted no erythema or warmth of the knee. No pain or warmth or redness of her calf. No fever, sweats, chills.    Objective:   Physical Exam Vital signs are reviewed GEN.: Well-developed female no acute distress KNEE: Right. No effusion, no erythema, no warmth. Full range of motion in flexion extension. Mild tenderness to palpation at the anterior medial joint line of the lateral joint line is nontender. Popliteal spaces soft. Calf is soft. Distally she is neurovascularly intact. SKIN: Skin around the right knee joint is normal. vascular: Posterior tibialis pulses and dorsalis pedis pulses 2+ bilaterally equal.  INJECTION: Patient was given informed consent, signed copy in the chart. Appropriate time out was taken. Area prepped and draped in usual sterile fashion. 1 cc of methylprednisolone 40 mg/ml plus  4 cc of 1% lidocaine without epinephrine was injected into the right knee joint using a(n) anterior medial approach. The patient tolerated the procedure well. There were no complications. Post procedure instructions were given.          Assessment & Plan:

## 2014-06-28 NOTE — Patient Instructions (Signed)
If you continue to have a lot of locking, catching or giving way of the right knee we probably need to do an MRI at some point. Let us hope the knee injection helps!

## 2014-06-28 NOTE — Assessment & Plan Note (Signed)
Corticosteroid injection today. Given her recent symptoms of catching and locking a little concern for meniscal tear with some type of mechanical issue. If her symptoms improve then I can follow her up when necessary. She has continued catching or locking or worsening of symptoms I think we should proceed with MRI to see if there truly is some meniscal damage that might require repair. She is in agreement with this plan.

## 2014-12-20 ENCOUNTER — Ambulatory Visit: Payer: Self-pay | Attending: Family Medicine

## 2015-01-13 ENCOUNTER — Other Ambulatory Visit: Payer: Self-pay | Admitting: Internal Medicine

## 2015-01-23 ENCOUNTER — Ambulatory Visit: Payer: Self-pay | Attending: Family Medicine | Admitting: Family Medicine

## 2015-01-23 ENCOUNTER — Encounter: Payer: Self-pay | Admitting: Family Medicine

## 2015-01-23 VITALS — BP 152/80 | HR 66 | Temp 98.2°F | Resp 16 | Ht 63.0 in | Wt 232.0 lb

## 2015-01-23 DIAGNOSIS — E785 Hyperlipidemia, unspecified: Secondary | ICD-10-CM | POA: Insufficient documentation

## 2015-01-23 DIAGNOSIS — Z6841 Body Mass Index (BMI) 40.0 and over, adult: Secondary | ICD-10-CM | POA: Insufficient documentation

## 2015-01-23 DIAGNOSIS — Z79899 Other long term (current) drug therapy: Secondary | ICD-10-CM | POA: Insufficient documentation

## 2015-01-23 DIAGNOSIS — Z87891 Personal history of nicotine dependence: Secondary | ICD-10-CM | POA: Insufficient documentation

## 2015-01-23 DIAGNOSIS — I10 Essential (primary) hypertension: Secondary | ICD-10-CM | POA: Insufficient documentation

## 2015-01-23 LAB — COMPLETE METABOLIC PANEL WITH GFR
ALT: 22 U/L (ref 6–29)
AST: 20 U/L (ref 10–35)
Albumin: 4.2 g/dL (ref 3.6–5.1)
Alkaline Phosphatase: 95 U/L (ref 33–130)
BILIRUBIN TOTAL: 0.4 mg/dL (ref 0.2–1.2)
BUN: 12 mg/dL (ref 7–25)
CHLORIDE: 101 mmol/L (ref 98–110)
CO2: 28 mmol/L (ref 20–31)
CREATININE: 0.96 mg/dL (ref 0.50–0.99)
Calcium: 9.1 mg/dL (ref 8.6–10.4)
GFR, Est African American: 73 mL/min (ref >=60)
GFR, Est Non African American: 63 mL/min (ref >=60)
Glucose, Bld: 83 mg/dL (ref 65–99)
Potassium: 4.6 mmol/L (ref 3.5–5.3)
Sodium: 137 mmol/L (ref 135–146)
TOTAL PROTEIN: 6.7 g/dL (ref 6.1–8.1)

## 2015-01-23 LAB — LIPID PANEL
Cholesterol: 185 mg/dL (ref 125–200)
HDL: 35 mg/dL — ABNORMAL LOW (ref >=46)
LDL CALC: 115 mg/dL (ref <130)
Total CHOL/HDL Ratio: 5.3 Ratio — ABNORMAL HIGH (ref <=5.0)
Triglycerides: 175 mg/dL — ABNORMAL HIGH (ref <150)
VLDL: 35 mg/dL — AB (ref <30)

## 2015-01-23 MED ORDER — LOSARTAN POTASSIUM 100 MG PO TABS: 100.0000 mg | ORAL_TABLET | Freq: Every day | ORAL | Status: DC

## 2015-01-23 NOTE — Assessment & Plan Note (Signed)
Obesity  Weight loss plan discussed

## 2015-01-23 NOTE — Assessment & Plan Note (Signed)
A: compliant with losartan, BP elevated in setting of weight gain P: Refilled losartan Low carb diet and exercise with goal of weight loss

## 2015-01-23 NOTE — Patient Instructions (Addendum)
Angelica Harvey was seen today for hypertension.  Diagnoses and all orders for this visit:  Essential hypertension -     losartan (COZAAR) 100 MG tablet; Take 1 tablet (100 mg total) by mouth daily. -     COMPLETE METABOLIC PANEL WITH GFR  Dyslipidemia -     Lipid Panel   Check out this diet  Ketogenic diet  [email or web address]  F/u in 4-6 weeks for pap smear  Dr. Adrian Blackwater

## 2015-01-23 NOTE — Progress Notes (Signed)
F/U HTN Elevated BP  No Hx tobacco

## 2015-01-23 NOTE — Progress Notes (Signed)
Patient ID: Angelica Harvey, female   DOB: Jan 10, 1951, 64 y.o.   MRN: [id number]   Subjective:  Patient ID: Angelica Harvey, female    DOB: 28-Oct-1950  Age: 64 y.o. MRN: [id number]  CC: Hypertension   HPI Angelica Harvey presents for   1. CHRONIC HYPERTENSION  Disease Monitoring  Blood pressure range: SBPs in 140s-150s  Chest pain: no   Dyspnea: no   Claudication: no   Medication compliance: yes  Medication Side Effects  Lightheadedness: no   Urinary frequency: no   Edema: no    Preventitive Healthcare:  Exercise: yes, minimal to moderate    Diet Pattern: 2 meals per day   Salt Restriction: yes  Social History  Substance Use Topics  . Smoking status: Former Smoker -- 1.00 packs/day for 17 years    Types: Cigarettes    Quit date: 04/12/1990  . Smokeless tobacco: Never Used  . Alcohol Use: Yes     Comment: ocassional    Outpatient Prescriptions Prior to Visit  Medication Sig Dispense Refill  . citalopram (CELEXA) 20 MG tablet Take 2 tablets (40 mg total) by mouth daily. 30 tablet 6  . Lactobacillus (PROBIOTIC ACIDOPHILUS PO) Take by mouth daily.    Marland Kitchen losartan (COZAAR) 100 MG tablet TAKE 1 TABLET BY MOUTH DAILY 30 tablet 5  . Multiple Vitamin (MULTIVITAMIN) tablet Take 1 tablet by mouth daily.    . pantoprazole (PROTONIX) 40 MG tablet Take 1 tablet (40 mg total) by mouth daily. 30 tablet 3  . famotidine (PEPCID) 20 MG tablet Take 1 tablet (20 mg total) by mouth 2 (two) times daily. (Patient not taking: Reported on 01/23/2015) 30 tablet 3  . meloxicam (MOBIC) 15 MG tablet Take 15 mg by mouth as needed.     No facility-administered medications prior to visit.    ROS Review of Systems  Constitutional: Negative for fever and chills.  Eyes: Negative for visual disturbance.  Respiratory: Negative for shortness of breath.   Cardiovascular: Negative for chest pain.  Gastrointestinal: Negative for abdominal pain and blood in stool.  Musculoskeletal: Negative for back pain and  arthralgias.  Skin: Negative for rash.  Allergic/Immunologic: Negative for immunocompromised state.  Hematological: Negative for adenopathy. Does not bruise/bleed easily.  Psychiatric/Behavioral: Negative for suicidal ideas and dysphoric mood.   Objective:  BP 152/80 mmHg  Pulse 66  Temp(Src) 98.2 F (36.8 C) (Oral)  Resp 16  Ht 5\' 3"  (1.6 m)  Wt 232 lb (105.235 kg)  BMI 41.11 kg/m2  SpO2 94%  BP/Weight 01/23/2015 06/28/2014 31/54/0086  Systolic BP 761 950 932  Diastolic BP 80 73 79  Wt. (Lbs) 232 226 224.2  BMI 41.11 40.04 40.02   Physical Exam  Constitutional: She is oriented to person, place, and time. She appears well-developed and well-nourished. No distress.  HENT:  Head: Normocephalic and atraumatic.  Cardiovascular: Normal rate, regular rhythm, normal heart sounds and intact distal pulses.   Pulmonary/Chest: Effort normal and breath sounds normal.  Musculoskeletal: She exhibits no edema.  Neurological: She is alert and oriented to person, place, and time.  Skin: Skin is warm and dry. No rash noted.  Psychiatric: She has a normal mood and affect.    Assessment & Plan:   Problem List Items Addressed This Visit    Dyslipidemia   Relevant Orders   Lipid Panel   Essential hypertension - Primary (Chronic)    A: compliant with losartan, BP elevated in setting of weight gain P: Refilled losartan Low  carb diet and exercise with goal of weight loss       Relevant Medications   losartan (COZAAR) 100 MG tablet   Other Relevant Orders   COMPLETE METABOLIC PANEL WITH GFR   Severe obesity (BMI >= 40) (HCC)    Obesity  Weight loss plan discussed          No orders of the defined types were placed in this encounter.    Follow-up: No Follow-up on file.   Boykin Nearing MD

## 2015-02-20 ENCOUNTER — Ambulatory Visit: Payer: Self-pay | Attending: Family Medicine | Admitting: Family Medicine

## 2015-02-20 ENCOUNTER — Encounter: Payer: Self-pay | Admitting: Family Medicine

## 2015-02-20 VITALS — BP 146/85 | HR 63 | Temp 98.4°F | Resp 18 | Ht 63.0 in | Wt 234.0 lb

## 2015-02-20 DIAGNOSIS — Z124 Encounter for screening for malignant neoplasm of cervix: Secondary | ICD-10-CM | POA: Insufficient documentation

## 2015-02-20 NOTE — Progress Notes (Signed)
Patient here for pap. Patient reports feeling well and does not need any medication refills at this time.

## 2015-02-20 NOTE — Progress Notes (Signed)
Patient ID: Angelica Harvey, female   DOB: Mar 19, 1951, 64 y.o.   MRN: [id number]   Subjective:  Patient ID: Angelica Harvey, female    DOB: 1950-12-20  Age: 64 y.o. MRN: [id number]  CC: Gynecologic Exam   HPI SHANESSA LIEBLING presents for pap. She is sexually active with her husband only. No vaginal bleeding, discharge or irritation. No history of abnormal paps.   Social History  Substance Use Topics  . Smoking status: Former Smoker -- 1.00 packs/day for 17 years    Types: Cigarettes    Quit date: 04/12/1990  . Smokeless tobacco: Never Used  . Alcohol Use: Yes     Comment: ocassional   OB History    Gravida Para Term Preterm AB TAB SAB Ectopic Multiple Living   2 1   1 1    1       Outpatient Prescriptions Prior to Visit  Medication Sig Dispense Refill  . citalopram (CELEXA) 20 MG tablet Take 2 tablets (40 mg total) by mouth daily. 30 tablet 6  . Lactobacillus (PROBIOTIC ACIDOPHILUS PO) Take by mouth daily.    Marland Kitchen losartan (COZAAR) 100 MG tablet Take 1 tablet (100 mg total) by mouth daily. 30 tablet 5  . Multiple Vitamin (MULTIVITAMIN) tablet Take 1 tablet by mouth daily.     No facility-administered medications prior to visit.    ROS Review of Systems  Constitutional: Negative for fever and chills.  Eyes: Negative for visual disturbance.  Respiratory: Negative for shortness of breath.   Cardiovascular: Negative for chest pain.  Gastrointestinal: Negative for abdominal pain and blood in stool.  Genitourinary: Negative for vaginal bleeding, vaginal discharge and vaginal pain.  Musculoskeletal: Negative for back pain and arthralgias.  Skin: Negative for rash.  Allergic/Immunologic: Negative for immunocompromised state.  Hematological: Negative for adenopathy. Does not bruise/bleed easily.  Psychiatric/Behavioral: Negative for suicidal ideas and dysphoric mood.    Objective:  BP 146/85 mmHg  Pulse 63  Temp(Src) 98.4 F (36.9 C) (Oral)  Resp 18  Ht 5\' 3"  (1.6 m)  Wt 234 lb  (106.142 kg)  BMI 41.46 kg/m2  SpO2 100%  BP/Weight 02/20/2015 01/23/2015 123XX123  Systolic BP 123456 0000000 [id number]  Diastolic BP 85 80 73  Wt. (Lbs) 234 232 226  BMI 41.46 41.11 40.04   Physical Exam  Constitutional: She is oriented to person, place, and time. She appears well-developed and well-nourished. No distress.  HENT:  Head: Normocephalic and atraumatic.  Cardiovascular: Normal rate, regular rhythm, normal heart sounds and intact distal pulses.   Pulmonary/Chest: Effort normal and breath sounds normal.  Genitourinary: Vagina normal and uterus normal. Pelvic exam was performed with patient prone. There is no rash, tenderness or lesion on the right labia. There is no rash, tenderness or lesion on the left labia. Cervix exhibits no motion tenderness, no discharge and no friability.  Musculoskeletal: She exhibits no edema.  Lymphadenopathy:       Right: No inguinal adenopathy present.       Left: No inguinal adenopathy present.  Neurological: She is alert and oriented to person, place, and time.  Skin: Skin is warm and dry. No rash noted.  Psychiatric: She has a normal mood and affect.    Assessment & Plan:   Problem List Items Addressed This Visit    None    Visit Diagnoses    Pap smear for cervical cancer screening    -  Primary    Relevant Orders    Cytology - PAP  [place]       No orders of the defined types were placed in this encounter.    Follow-up: No Follow-up on file.   Boykin Nearing MD

## 2015-02-20 NOTE — Patient Instructions (Addendum)
Angelica Harvey was seen today for gynecologic exam.  Diagnoses and all orders for this visit:  Pap smear for cervical cancer screening -     Cytology - PAP [name]   Normal pelvic exam You will be called wit pap results   F/u in 3 months for HTN   Dr. Adrian Blackwater

## 2015-02-21 LAB — CERVICOVAGINAL ANCILLARY ONLY
CHLAMYDIA, DNA PROBE: NEGATIVE
NEISSERIA GONORRHEA: NEGATIVE
Wet Prep (BD Affirm): NEGATIVE

## 2015-02-21 LAB — CYTOLOGY - PAP

## 2015-02-26 ENCOUNTER — Telehealth: Payer: Self-pay | Admitting: *Deleted

## 2015-02-26 NOTE — Telephone Encounter (Signed)
-----   Message from Boykin Nearing, MD sent at 02/21/2015  8:54 AM EST ----- Negative wet prep

## 2015-02-26 NOTE — Telephone Encounter (Signed)
Unable to contact pt  Voice mail not set up yet  

## 2015-02-26 NOTE — Telephone Encounter (Signed)
-----   Message from Boykin Nearing, MD sent at 02/21/2015  3:46 PM EST ----- Negative screening Gc/chlam

## 2015-03-18 ENCOUNTER — Telehealth: Payer: Self-pay | Admitting: Family Medicine

## 2015-03-18 NOTE — Telephone Encounter (Signed)
Patient called back, please f/u with pt.  °

## 2015-03-20 NOTE — Telephone Encounter (Signed)
Pt here requesting lab results Date of birth verified by pt Negative GC/Chlam and wet prep Pt verbalized understanding

## 2015-03-20 NOTE — Telephone Encounter (Signed)
Results given to pt  Pt here in our office today

## 2015-05-23 ENCOUNTER — Encounter: Payer: Self-pay | Admitting: Family Medicine

## 2015-05-23 ENCOUNTER — Ambulatory Visit: Payer: BLUE CROSS/BLUE SHIELD | Attending: Family Medicine | Admitting: Family Medicine

## 2015-05-23 VITALS — BP 144/80 | HR 66 | Temp 97.9°F | Resp 16 | Ht 63.0 in | Wt 231.0 lb

## 2015-05-23 DIAGNOSIS — Z79899 Other long term (current) drug therapy: Secondary | ICD-10-CM | POA: Insufficient documentation

## 2015-05-23 DIAGNOSIS — Z0000 Encounter for general adult medical examination without abnormal findings: Secondary | ICD-10-CM | POA: Diagnosis not present

## 2015-05-23 DIAGNOSIS — Z87891 Personal history of nicotine dependence: Secondary | ICD-10-CM | POA: Diagnosis not present

## 2015-05-23 DIAGNOSIS — Z6841 Body Mass Index (BMI) 40.0 and over, adult: Secondary | ICD-10-CM | POA: Diagnosis not present

## 2015-05-23 DIAGNOSIS — I10 Essential (primary) hypertension: Secondary | ICD-10-CM | POA: Diagnosis present

## 2015-05-23 LAB — POCT GLYCOSYLATED HEMOGLOBIN (HGB A1C): Hemoglobin A1C: 5.8

## 2015-05-23 MED ORDER — LOSARTAN POTASSIUM 100 MG PO TABS: 100.0000 mg | ORAL_TABLET | Freq: Every day | ORAL | Status: DC

## 2015-05-23 NOTE — Patient Instructions (Addendum)
Lotta was seen today for hypertension.  Diagnoses and all orders for this visit:  Healthcare maintenance -     POCT glycosylated hemoglobin (Hb A1C)  Essential hypertension -     losartan (COZAAR) 100 MG tablet; Take 1 tablet (100 mg total) by mouth daily. -     Basic Metabolic Panel; Future   I recommend the ketogenic diet for weight loss along with exercise  [email or web address]  F/u in 3 months for HTN and weight check   Dr. Adrian Blackwater

## 2015-05-23 NOTE — Progress Notes (Signed)
Subjective:  Patient ID: Angelica Harvey, female    DOB: [birth year]  Age: 65 y.o. MRN: [id number]  CC: Hypertension   HPI Angelica Harvey presents for   1. CHRONIC HYPERTENSION  Disease Monitoring  Blood pressure range: not checking   Chest pain: no   Dyspnea: no   Claudication: no   Medication compliance: yes  Medication Side Effects  Lightheadedness: no   Urinary frequency: no   Edema: no     2. Obesity: she desires to loss weight. Working to find a diet that works well or her. She does not exercise. She has looked into [employer].    Social History  Substance Use Topics  . Smoking status: Former Smoker -- 1.00 packs/day for 17 years    Types: Cigarettes    Quit date: 04/12/1990  . Smokeless tobacco: Never Used  . Alcohol Use: Yes     Comment: ocassional    Outpatient Prescriptions Prior to Visit  Medication Sig Dispense Refill  . citalopram (CELEXA) 20 MG tablet Take 2 tablets (40 mg total) by mouth daily. 30 tablet 6  . Lactobacillus (PROBIOTIC ACIDOPHILUS PO) Take by mouth daily.    Marland Kitchen losartan (COZAAR) 100 MG tablet Take 1 tablet (100 mg total) by mouth daily. 30 tablet 5  . Multiple Vitamin (MULTIVITAMIN) tablet Take 1 tablet by mouth daily.     No facility-administered medications prior to visit.    ROS Review of Systems  Constitutional: Negative for fever and chills.  Eyes: Negative for visual disturbance.  Respiratory: Negative for shortness of breath.   Cardiovascular: Negative for chest pain.  Gastrointestinal: Negative for abdominal pain and blood in stool.  Genitourinary: Negative for vaginal bleeding, vaginal discharge and vaginal pain.  Musculoskeletal: Negative for back pain and arthralgias.  Skin: Negative for rash.  Allergic/Immunologic: Negative for immunocompromised state.  Hematological: Negative for adenopathy. Does not bruise/bleed easily.  Psychiatric/Behavioral: Negative for suicidal ideas and dysphoric mood.    Objective:  BP  144/80 mmHg  Pulse 66  Temp(Src) 97.9 F (36.6 C) (Oral)  Resp 16  Ht 5\' 3"  (1.6 m)  Wt 231 lb (104.781 kg)  BMI 40.93 kg/m2  SpO2 96%  BP/Weight 05/23/2015 02/20/2015 123XX123  Systolic BP 123456 123456 0000000  Diastolic BP 80 85 80  Wt. (Lbs) 231 234 232  BMI 40.93 41.46 41.11    Physical Exam  Constitutional: She is oriented to person, place, and time. She appears well-developed and well-nourished. No distress.  HENT:  Head: Normocephalic and atraumatic.  Cardiovascular: Normal rate, regular rhythm, normal heart sounds and intact distal pulses.   Pulmonary/Chest: Effort normal and breath sounds normal.  Musculoskeletal: She exhibits no edema.  Neurological: She is alert and oriented to person, place, and time.  Skin: Skin is warm and dry. No rash noted.  Psychiatric: She has a normal mood and affect.   Lab Results  Component Value Date   HGBA1C 5.80 05/23/2015    Assessment & Plan:   Angelica Harvey was seen today for hypertension.  Diagnoses and all orders for this visit:  Healthcare maintenance -     POCT glycosylated hemoglobin (Hb A1C)  Essential hypertension -     losartan (COZAAR) 100 MG tablet; Take 1 tablet (100 mg total) by mouth daily. -     Basic Metabolic Panel; Future  Morbid obesity, unspecified obesity type ([place])     No orders of the defined types were placed in this encounter.    Follow-up: No Follow-up  on file.   Boykin Nearing MD

## 2015-05-23 NOTE — Progress Notes (Signed)
F/u HTN, Chronic cough  No pain today  No tobacco user  No suicidal thoughts in the past two weeks

## 2015-05-24 ENCOUNTER — Encounter: Payer: Self-pay | Admitting: Family Medicine

## 2015-05-24 NOTE — Assessment & Plan Note (Signed)
A: obesity  P:  Recommended ketogenic diet

## 2015-05-24 NOTE — Assessment & Plan Note (Signed)
A: Blood pressure at goal. Meds: compliant  P: I will continue the patient's current medication regimen since her blood pressure is at goal.   

## 2015-05-29 ENCOUNTER — Emergency Department (HOSPITAL_COMMUNITY)
Admission: EM | Admit: 2015-05-29 | Discharge: 2015-05-29 | Disposition: A | Discharge status: Home / Self Care | Payer: BLUE CROSS/BLUE SHIELD | Source: Home / Self Care | Attending: Family Medicine | Admitting: Family Medicine

## 2015-05-29 ENCOUNTER — Encounter (HOSPITAL_COMMUNITY): Payer: Self-pay | Admitting: *Deleted

## 2015-05-29 ENCOUNTER — Emergency Department (INDEPENDENT_AMBULATORY_CARE_PROVIDER_SITE_OTHER): Payer: BLUE CROSS/BLUE SHIELD

## 2015-05-29 DIAGNOSIS — J069 Acute upper respiratory infection, unspecified: Secondary | ICD-10-CM

## 2015-05-29 MED ORDER — AZITHROMYCIN 250 MG PO TABS: ORAL_TABLET | ORAL | Status: DC

## 2015-05-29 MED ORDER — HYDROCOD POLST-CPM POLST ER 10-8 MG/5ML PO SUER: 5.0000 mL | Freq: Two times a day (BID) | ORAL | Status: DC | PRN

## 2015-05-29 NOTE — ED Provider Notes (Signed)
CSN: [id number]     Arrival date & time 05/29/15  1548 History   First MD Initiated Contact with Patient 05/29/15 1632     Chief Complaint  Patient presents with  . Cough   (Consider location/radiation/quality/duration/timing/severity/associated sxs/prior Treatment) Patient is a 65 y.o. female presenting with cough. The history is provided by the patient.  Cough Cough characteristics:  Non-productive and dry Severity:  Moderate Onset quality:  Gradual Progression:  Worsening Chronicity:  New Smoker: no   Context comment:  No flu vacc. Relieved by:  None tried Worsened by:  Nothing tried Ineffective treatments:  None tried Associated symptoms: chest pain   Associated symptoms: no ear fullness, no fever, no rhinorrhea, no shortness of breath and no wheezing     Past Medical History  Diagnosis Date  . Depression   . Hypertension   . History of hypokalemia   . Hypercholesterolemia   . Obesity   . Pneumonia   . GERD (gastroesophageal reflux disease)   . Anxiety   . Colon polyps    Past Surgical History  Procedure Laterality Date  . Cesarean section     Family History  Problem Relation Age of Onset  . Heart attack Father   . Heart disease Father     CABG  . Colon cancer Maternal Aunt     x 2  . Colon polyps Mother   . Kidney disease Sister     dialysis   Social History  Substance Use Topics  . Smoking status: Former Smoker -- 1.00 packs/day for 17 years    Types: Cigarettes    Quit date: 04/12/1990  . Smokeless tobacco: Never Used  . Alcohol Use: Yes     Comment: ocassional   OB History    Gravida Para Term Preterm AB TAB SAB Ectopic Multiple Living   2 1   1 1    1      Review of Systems  Constitutional: Negative.  Negative for fever.  HENT: Negative.  Negative for rhinorrhea.   Respiratory: Positive for cough. Negative for shortness of breath and wheezing.   Cardiovascular: Positive for chest pain. Negative for palpitations and leg swelling.  Skin:  Negative.   All other systems reviewed and are negative.   Allergies  Belladonna; Hctz; Phenobarbital; and Quinine  Home Medications   Prior to Admission medications   Medication Sig Start Date End Date Taking? Authorizing Provider  azithromycin (ZITHROMAX Z-PAK) 250 MG tablet Take as directed on pack 05/29/15   Billy Fischer, MD  chlorpheniramine-HYDROcodone [facility] ER) 10-8 MG/5ML SUER Take 5 mLs by mouth every 12 (twelve) hours as needed for cough. 05/29/15   Billy Fischer, MD  citalopram (CELEXA) 20 MG tablet Take 2 tablets (40 mg total) by mouth daily. 03/19/13   Robbie Lis, MD  Lactobacillus (PROBIOTIC ACIDOPHILUS PO) Take by mouth daily.    Historical Provider, MD  losartan (COZAAR) 100 MG tablet Take 1 tablet (100 mg total) by mouth daily. 05/23/15   Boykin Nearing, MD  Multiple Vitamin (MULTIVITAMIN) tablet Take 1 tablet by mouth daily.    Historical Provider, MD   Meds Ordered and Administered this Visit  Medications - No data to display  BP 148/85 mmHg  Pulse 71  Temp(Src) 99 F (37.2 C) (Oral)  Resp 20  SpO2 95% No data found.   Physical Exam  Constitutional: She is oriented to person, place, and time. She appears well-developed and well-nourished. No distress.  HENT:  Right Ear:  External ear normal.  Left Ear: External ear normal.  Mouth/Throat: Oropharynx is clear and moist.  Neck: Normal range of motion. Neck supple.  Cardiovascular: Normal heart sounds and intact distal pulses.   Pulmonary/Chest: Effort normal. No respiratory distress. She has no wheezes. She has rhonchi.  Lymphadenopathy:    She has no cervical adenopathy.  Neurological: She is alert and oriented to person, place, and time.  Skin: Skin is warm and dry.  Nursing note and vitals reviewed.   ED Course  Procedures (including critical care time)  Labs Review Labs Reviewed - No data to display  Imaging Review X-rays reviewed and report per radiologist.    Visual  Acuity Review  Right Eye Distance:   Left Eye Distance:   Bilateral Distance:    Right Eye Near:   Left Eye Near:    Bilateral Near:         MDM   1. URI (upper respiratory infection)    Meds ordered this encounter  Medications  . azithromycin (ZITHROMAX Z-PAK) 250 MG tablet    Sig: Take as directed on pack    Dispense:  6 tablet    Refill:  0  . chlorpheniramine-HYDROcodone (TUSSIONEX PENNKINETIC ER) 10-8 MG/5ML SUER    Sig: Take 5 mLs by mouth every 12 (twelve) hours as needed for cough.    Dispense:  140 mL    Refill:  0       Billy Fischer, MD 05/29/15 1714

## 2015-05-29 NOTE — ED Notes (Signed)
Pt    Reports        Symptoms  Of   Cough      Wheezing         Congested          With  Symptoms        X   1    Week         Cough      She  Reprts        Coughing  Up blood  As   Well

## 2015-05-29 NOTE — Discharge Instructions (Signed)
Drink plenty of fluids as discussed, use medicine as prescribed, and mucinex or delsym for cough. Return or see your doctor if further problems °

## 2015-06-17 ENCOUNTER — Telehealth: Payer: Self-pay | Admitting: Family Medicine

## 2015-06-17 DIAGNOSIS — R05 Cough: Secondary | ICD-10-CM

## 2015-06-17 DIAGNOSIS — R059 Cough, unspecified: Secondary | ICD-10-CM

## 2015-06-17 NOTE — Telephone Encounter (Signed)
Patient was diagnosed with bronchitis a couple of weeks ago and was prescribe a cough syrup. Patient has ran out and would like to know if she can get a refill ( chlorpheniramine-HYDROcodone [facility]) 10-8 MG/5ML Latanya Presser [id number]....please follow up with patient

## 2015-06-19 MED ORDER — BENZONATATE 100 MG PO CAPS: 100.0000 mg | ORAL_CAPSULE | Freq: Two times a day (BID) | ORAL | Status: DC | PRN

## 2015-06-19 MED FILL — BENZONATATE 100 MG CAPSULE: 100 | 10 days supply | Qty: 20 | Fill #0

## 2015-06-19 NOTE — Telephone Encounter (Signed)
No refilled on that cough medicine tesslon perles sent in

## 2015-06-20 MED ORDER — BENZONATATE 100 MG PO CAPS: 100.0000 mg | ORAL_CAPSULE | Freq: Two times a day (BID) | ORAL | Status: DC | PRN

## 2015-06-20 NOTE — Telephone Encounter (Signed)
Pt returned call

## 2015-06-20 NOTE — Telephone Encounter (Signed)
Pt returned call  Notified Rx  send to [place]  Requesting Rx send to [facility]

## 2015-06-20 NOTE — Telephone Encounter (Signed)
LVM to return call.

## 2015-06-26 ENCOUNTER — Encounter: Payer: Self-pay | Admitting: Family Medicine

## 2015-06-26 ENCOUNTER — Ambulatory Visit: Payer: BLUE CROSS/BLUE SHIELD | Attending: Family Medicine | Admitting: Family Medicine

## 2015-06-26 VITALS — BP 133/72 | HR 72 | Temp 98.1°F | Resp 16 | Ht 63.0 in | Wt 224.0 lb

## 2015-06-26 DIAGNOSIS — Z87891 Personal history of nicotine dependence: Secondary | ICD-10-CM | POA: Insufficient documentation

## 2015-06-26 DIAGNOSIS — Z79899 Other long term (current) drug therapy: Secondary | ICD-10-CM | POA: Diagnosis not present

## 2015-06-26 DIAGNOSIS — G4700 Insomnia, unspecified: Secondary | ICD-10-CM | POA: Insufficient documentation

## 2015-06-26 DIAGNOSIS — Z23 Encounter for immunization: Secondary | ICD-10-CM

## 2015-06-26 MED ORDER — DIPHENHYDRAMINE HCL 25 MG PO TABS: 25.0000 mg | ORAL_TABLET | Freq: Every evening | ORAL | Status: DC | PRN

## 2015-06-26 NOTE — Assessment & Plan Note (Signed)
A: acute insomnia P: Benadryl prn

## 2015-06-26 NOTE — Patient Instructions (Addendum)
Quintella was seen today for insomnia.  Diagnoses and all orders for this visit:  Insomnia -     diphenhydrAMINE (BENADRYL) 25 MG tablet; Take 1 tablet (25 mg total) by mouth at bedtime as needed for sleep.  Other orders -     Tdap vaccine greater than or equal to 64yo IM    F/u in 3 months for HTN  Dr. Adrian Blackwater

## 2015-06-26 NOTE — Progress Notes (Signed)
Subjective:  Patient ID: Angelica Harvey, female    DOB: Oct 10, 1950  Age: 65 y.o. MRN: [id number]  CC: Insomnia   HPI [name] presents for    1. Insomnia: x 2-3 weeks. During a recent respiratory illness. Sleeping during the day. Having trouble staying asleep at night. No anxiety or pain. resp illness improving.   Social History  Substance Use Topics  . Smoking status: Former Smoker -- 1.00 packs/day for 17 years    Types: Cigarettes    Quit date: 04/12/1990  . Smokeless tobacco: Never Used  . Alcohol Use: Yes     Comment: ocassional   Outpatient Prescriptions Prior to Visit  Medication Sig Dispense Refill  . azithromycin (ZITHROMAX Z-PAK) 250 MG tablet Take as directed on pack 6 tablet 0  . benzonatate (TESSALON) 100 MG capsule Take 1 capsule (100 mg total) by mouth 2 (two) times daily as needed for cough. 20 capsule 0  . chlorpheniramine-HYDROcodone (TUSSIONEX PENNKINETIC ER) 10-8 MG/5ML SUER Take 5 mLs by mouth every 12 (twelve) hours as needed for cough. 140 mL 0  . citalopram (CELEXA) 20 MG tablet Take 2 tablets (40 mg total) by mouth daily. 30 tablet 6  . Lactobacillus (PROBIOTIC ACIDOPHILUS PO) Take by mouth daily.    Marland Kitchen losartan (COZAAR) 100 MG tablet Take 1 tablet (100 mg total) by mouth daily. 30 tablet 5  . Multiple Vitamin (MULTIVITAMIN) tablet Take 1 tablet by mouth daily.     No facility-administered medications prior to visit.    ROS Review of Systems  Constitutional: Negative for fever and chills.  Eyes: Negative for visual disturbance.  Respiratory: Positive for cough. Negative for shortness of breath.   Cardiovascular: Negative for chest pain.  Gastrointestinal: Negative for abdominal pain and blood in stool.  Musculoskeletal: Negative for back pain and arthralgias.  Skin: Negative for rash.  Allergic/Immunologic: Negative for immunocompromised state.  Hematological: Negative for adenopathy. Does not bruise/bleed easily.  Psychiatric/Behavioral:  Positive for sleep disturbance. Negative for suicidal ideas and dysphoric mood.    Objective:  BP 133/72 mmHg  Pulse 72  Temp(Src) 98.1 F (36.7 C) (Oral)  Resp 16  Ht 5\' 3"  (1.6 m)  Wt 224 lb (101.606 kg)  BMI 39.69 kg/m2  SpO2 96%  BP/Weight 06/26/2015 05/29/2015 [id number]  Systolic BP Q000111Q 123456 123456  Diastolic BP 72 85 80  Wt. (Lbs) 224 - 231  BMI 39.69 - 40.93    Physical Exam  Constitutional: She is oriented to person, place, and time. She appears well-developed and well-nourished. No distress.  HENT:  Head: Normocephalic and atraumatic.  Cardiovascular: Normal rate, regular rhythm, normal heart sounds and intact distal pulses.   Pulmonary/Chest: Effort normal and breath sounds normal.  Musculoskeletal: She exhibits no edema.  Neurological: She is alert and oriented to person, place, and time.  Skin: Skin is warm and dry. No rash noted.  Psychiatric: She has a normal mood and affect.    Assessment & Plan:   Babetta was seen today for insomnia.  Diagnoses and all orders for this visit:  Insomnia -     diphenhydrAMINE (BENADRYL) 25 MG tablet; Take 1 tablet (25 mg total) by mouth at bedtime as needed for sleep.  Other orders -     Tdap vaccine greater than or equal to 7yo IM   Meds ordered this encounter  Medications  . diphenhydrAMINE (BENADRYL) 25 MG tablet    Sig: Take 1 tablet (25 mg total) by mouth at bedtime as  needed for sleep.    Dispense:  30 tablet    Refill:  0    Follow-up: No Follow-up on file.   Boykin Nearing MD

## 2015-06-26 NOTE — Progress Notes (Signed)
F/U cough  Stated not sleeping good , low energy  No tobacco user  No suicidal thoughts in the past two weeks  No pain today

## 2015-06-27 ENCOUNTER — Encounter: Payer: Self-pay | Admitting: Clinical

## 2015-06-27 NOTE — Progress Notes (Signed)
Depression screen [facility] 2/9 06/26/2015 05/23/2015 02/20/2015 01/23/2015 06/28/2014  Decreased Interest 3 1 0 0 0  Down, Depressed, Hopeless 1 1 0 0 0  PHQ - 2 Score 4 2 0 0 0  Altered sleeping 1 0 - - -  Tired, decreased energy 3 1 - - -  Change in appetite 1 1 - - -  Feeling bad or failure about yourself  0 0 - - -  Trouble concentrating 1 - - - -  Moving slowly or fidgety/restless 0 0 - - -  Suicidal thoughts 0 0 - - -  PHQ-9 Score 10 4 - - -    GAD 7 : Generalized Anxiety Score 06/26/2015 05/23/2015  Nervous, Anxious, on Edge 1 1  Control/stop worrying 0 0  Worry too much - different things 0 1  Trouble relaxing 0 0  Restless 0 0  Easily annoyed or irritable 0 0  Afraid - awful might happen 0 0  Total GAD 7 Score 1 2

## 2015-07-21 ENCOUNTER — Other Ambulatory Visit: Payer: Self-pay | Admitting: Family Medicine

## 2015-11-20 ENCOUNTER — Telehealth: Payer: Self-pay | Admitting: Family Medicine

## 2015-11-20 NOTE — Telephone Encounter (Signed)
Pt called the office asking to speak with nurse as well as make an appointment regarding the discomfort that she is experiencing on her foot. Also, would like a referral to a podiatrist. Please f/u

## 2015-11-24 NOTE — Telephone Encounter (Signed)
Pt. Called requesting to speak with her nurse regarding a referral that she needs to the podiatrist. Pt. States she has pain in her left foot. Please f/u with pt.

## 2015-11-27 NOTE — Telephone Encounter (Signed)
This is a new problem. She is not diabetic.  She will need an OV Recommend ibuprofen will awaiting visit

## 2015-11-27 NOTE — Telephone Encounter (Signed)
Received return call from patient; RN advised patient: per Dr. Adrian Blackwater this is a new problem that requires OV. Recommended ibuprofen will awaiting visit.  Patient verbalized understanding. Priscille Heidelberg, RN, BSN

## 2015-11-27 NOTE — Telephone Encounter (Signed)
Return call to patient at [phone], Left message to callback.  Priscille Heidelberg, RN, BSN

## 2015-12-01 ENCOUNTER — Ambulatory Visit: Payer: BLUE CROSS/BLUE SHIELD | Attending: Family Medicine | Admitting: Family Medicine

## 2015-12-01 ENCOUNTER — Encounter: Payer: Self-pay | Admitting: Family Medicine

## 2015-12-01 DIAGNOSIS — I10 Essential (primary) hypertension: Secondary | ICD-10-CM

## 2015-12-01 DIAGNOSIS — M722 Plantar fascial fibromatosis: Secondary | ICD-10-CM | POA: Diagnosis not present

## 2015-12-01 DIAGNOSIS — M79672 Pain in left foot: Secondary | ICD-10-CM | POA: Diagnosis present

## 2015-12-01 MED ORDER — LOSARTAN POTASSIUM 100 MG PO TABS: 100.0000 mg | ORAL_TABLET | Freq: Every day | ORAL | 11 refills | Status: DC

## 2015-12-01 NOTE — Patient Instructions (Addendum)
Angelica Harvey was seen today for foot pain.  Diagnoses and all orders for this visit:  Plantar fasciitis of left foot     Health Maintenance Due  Topic Date Due  . Hepatitis C Screening  22-Aug-1950  . HIV Screening  10/16/1966  . COLONOSCOPY  10/15/2001  . ZOSTAVAX  10/16/2011  . INFLUENZA VACCINE  11/11/2015   Inquire about c-scope and hep C screen with your insurance company. Call if they will cover.  L plantar fascitis  Ice foot for 20 minutes 2-3 times per day Work on weight loss Wear supportive shoes   Call if you would podiatry referral  F/u in 3 months sooner if needed   Dr. Adrian Blackwater   Plantar Fasciitis With Rehab The plantar fascia is a fibrous, ligament-like, soft-tissue structure that spans the bottom of the foot. Plantar fasciitis, also called heel spur syndrome, is a condition that causes pain in the foot due to inflammation of the tissue. SYMPTOMS   Pain and tenderness on the underneath side of the foot.  Pain that worsens with standing or walking. CAUSES  Plantar fasciitis is caused by irritation and injury to the plantar fascia on the underneath side of the foot. Common mechanisms of injury include:  Direct trauma to bottom of the foot.  Damage to a small nerve that runs under the foot where the main fascia attaches to the heel bone.  Stress placed on the plantar fascia due to bone spurs. RISK INCREASES WITH:   Activities that place stress on the plantar fascia (running, jumping, pivoting, or cutting).  Poor strength and flexibility.  Improperly fitted shoes.  Tight calf muscles.  Flat feet.  Failure to warm-up properly before activity.  Obesity. PREVENTION  Warm up and stretch properly before activity.  Allow for adequate recovery between workouts.  Maintain physical fitness:  Strength, flexibility, and endurance.  Cardiovascular fitness.  Maintain a health body weight.  Avoid stress on the plantar fascia.  Wear properly fitted  shoes, including arch supports for individuals who have flat feet. PROGNOSIS  If treated properly, then the symptoms of plantar fasciitis usually resolve without surgery. However, occasionally surgery is necessary. RELATED COMPLICATIONS   Recurrent symptoms that may result in a chronic condition.  Problems of the lower back that are caused by compensating for the injury, such as limping.  Pain or weakness of the foot during push-off following surgery.  Chronic inflammation, scarring, and partial or complete fascia tear, occurring more often from repeated injections. TREATMENT  Treatment initially involves the use of ice and medication to help reduce pain and inflammation. The use of strengthening and stretching exercises may help reduce pain with activity, especially stretches of the Achilles tendon. These exercises may be performed at home or with a therapist. Your caregiver may recommend that you use heel cups of arch supports to help reduce stress on the plantar fascia. Occasionally, corticosteroid injections are given to reduce inflammation. If symptoms persist for greater than 6 months despite non-surgical (conservative), then surgery may be recommended.  MEDICATION   If pain medication is necessary, then nonsteroidal anti-inflammatory medications, such as aspirin and ibuprofen, or other minor pain relievers, such as acetaminophen, are often recommended.  Do not take pain medication within 7 days before surgery.  Prescription pain relievers may be given if deemed necessary by your caregiver. Use only as directed and only as much as you need.  Corticosteroid injections may be given by your caregiver. These injections should be reserved for the most serious cases,  because they may only be given a certain number of times. HEAT AND COLD  Cold treatment (icing) relieves pain and reduces inflammation. Cold treatment should be applied for 10 to 15 minutes every 2 to 3 hours for inflammation  and pain and immediately after any activity that aggravates your symptoms. Use ice packs or massage the area with a piece of ice (ice massage).  Heat treatment may be used prior to performing the stretching and strengthening activities prescribed by your caregiver, physical therapist, or athletic trainer. Use a heat pack or soak the injury in warm water. SEEK IMMEDIATE MEDICAL CARE IF:  Treatment seems to offer no benefit, or the condition worsens.  Any medications produce adverse side effects. EXERCISES RANGE OF MOTION (ROM) AND STRETCHING EXERCISES - Plantar Fasciitis (Heel Spur Syndrome) These exercises may help you when beginning to rehabilitate your injury. Your symptoms may resolve with or without further involvement from your physician, physical therapist or athletic trainer. While completing these exercises, remember:   Restoring tissue flexibility helps normal motion to return to the joints. This allows healthier, less painful movement and activity.  An effective stretch should be held for at least 30 seconds.  A stretch should never be painful. You should only feel a gentle lengthening or release in the stretched tissue. RANGE OF MOTION - Toe Extension, Flexion  Sit with your right / left leg crossed over your opposite knee.  Grasp your toes and gently pull them back toward the top of your foot. You should feel a stretch on the bottom of your toes and/or foot.  Hold this stretch for __________ seconds.  Now, gently pull your toes toward the bottom of your foot. You should feel a stretch on the top of your toes and or foot.  Hold this stretch for __________ seconds. Repeat __________ times. Complete this stretch __________ times per day.  RANGE OF MOTION - Ankle Dorsiflexion, Active Assisted  Remove shoes and sit on a chair that is preferably not on a carpeted surface.  Place right / left foot under knee. Extend your opposite leg for support.  Keeping your heel down, slide  your right / left foot back toward the chair until you feel a stretch at your ankle or calf. If you do not feel a stretch, slide your bottom forward to the edge of the chair, while still keeping your heel down.  Hold this stretch for __________ seconds. Repeat __________ times. Complete this stretch __________ times per day.  STRETCH - Gastroc, Standing  Place hands on wall.  Extend right / left leg, keeping the front knee somewhat bent.  Slightly point your toes inward on your back foot.  Keeping your right / left heel on the floor and your knee straight, shift your weight toward the wall, not allowing your back to arch.  You should feel a gentle stretch in the right / left calf. Hold this position for __________ seconds. Repeat __________ times. Complete this stretch __________ times per day. STRETCH - Soleus, Standing  Place hands on wall.  Extend right / left leg, keeping the other knee somewhat bent.  Slightly point your toes inward on your back foot.  Keep your right / left heel on the floor, bend your back knee, and slightly shift your weight over the back leg so that you feel a gentle stretch deep in your back calf.  Hold this position for __________ seconds. Repeat __________ times. Complete this stretch __________ times per day. STRETCH - Gastrocsoleus, Standing  Note: This exercise can place a lot of stress on your foot and ankle. Please complete this exercise only if specifically instructed by your caregiver.   Place the ball of your right / left foot on a step, keeping your other foot firmly on the same step.  Hold on to the wall or a rail for balance.  Slowly lift your other foot, allowing your body weight to press your heel down over the edge of the step.  You should feel a stretch in your right / left calf.  Hold this position for __________ seconds.  Repeat this exercise with a slight bend in your right / left knee. Repeat __________ times. Complete this  stretch __________ times per day.  STRENGTHENING EXERCISES - Plantar Fasciitis (Heel Spur Syndrome)  These exercises may help you when beginning to rehabilitate your injury. They may resolve your symptoms with or without further involvement from your physician, physical therapist or athletic trainer. While completing these exercises, remember:   Muscles can gain both the endurance and the strength needed for everyday activities through controlled exercises.  Complete these exercises as instructed by your physician, physical therapist or athletic trainer. Progress the resistance and repetitions only as guided. STRENGTH - Towel Curls  Sit in a chair positioned on a non-carpeted surface.  Place your foot on a towel, keeping your heel on the floor.  Pull the towel toward your heel by only curling your toes. Keep your heel on the floor.  If instructed by your physician, physical therapist or athletic trainer, add ____________________ at the end of the towel. Repeat __________ times. Complete this exercise __________ times per day. STRENGTH - Ankle Inversion  Secure one end of a rubber exercise band/tubing to a fixed object (table, pole). Loop the other end around your foot just before your toes.  Place your fists between your knees. This will focus your strengthening at your ankle.  Slowly, pull your big toe up and in, making sure the band/tubing is positioned to resist the entire motion.  Hold this position for __________ seconds.  Have your muscles resist the band/tubing as it slowly pulls your foot back to the starting position. Repeat __________ times. Complete this exercises __________ times per day.    This information is not intended to replace advice given to you by your health care provider. Make sure you discuss any questions you have with your health care provider.   Document Released: 03/29/2005 Document Revised: 08/13/2014 Document Reviewed: 07/11/2008 Elsevier Interactive  Patient Education [employer].

## 2015-12-01 NOTE — Assessment & Plan Note (Signed)
  L plantar fascitis in setting of weight gain  Ice foot for 20 minutes 2-3 times per day Work on weight loss Wear supportive shoes

## 2015-12-01 NOTE — Progress Notes (Signed)
Subjective:  Patient ID: Angelica Harvey, female    DOB: [birth year]  Age: 65 y.o. MRN: [id number]  CC: Foot Pain (left foot)   HPI Angelica Harvey presents for   1. Left heel pain: 3-4 weeks with heel pain that is worse during the end of the day. She walks around barefoot most of the time. She has gained 10 # since last OV 5 months ago. She has hx of plantar fascitis first diagnosed 25 years ago. She has done any home treatment yet.   Social History  Substance Use Topics  . Smoking status: Former Smoker    Packs/day: 1.00    Years: 17.00    Types: Cigarettes    Quit date: 04/12/1990  . Smokeless tobacco: Never Used  . Alcohol use Yes     Comment: ocassional    Outpatient Medications Prior to Visit  Medication Sig Dispense Refill  . losartan (COZAAR) 100 MG tablet Take 1 tablet (100 mg total) by mouth daily. 30 tablet 5  . benzonatate (TESSALON) 100 MG capsule Take 1 capsule (100 mg total) by mouth 2 (two) times daily as needed for cough. (Patient not taking: Reported on 12/01/2015) 20 capsule 0  . citalopram (CELEXA) 20 MG tablet Take 2 tablets (40 mg total) by mouth daily. (Patient not taking: Reported on 12/01/2015) 30 tablet 6  . diphenhydrAMINE (BENADRYL) 25 MG tablet Take 1 tablet (25 mg total) by mouth at bedtime as needed for sleep. (Patient not taking: Reported on 12/01/2015) 30 tablet 0  . Lactobacillus (PROBIOTIC ACIDOPHILUS PO) Take by mouth daily.    . Multiple Vitamin (MULTIVITAMIN) tablet Take 1 tablet by mouth daily.     No facility-administered medications prior to visit.     ROS Review of Systems  Constitutional: Negative for chills and fever.  Eyes: Negative for visual disturbance.  Respiratory: Negative for shortness of breath.   Cardiovascular: Negative for chest pain.  Gastrointestinal: Negative for abdominal pain and blood in stool.  Musculoskeletal: Positive for arthralgias. Negative for back pain.  Skin: Negative for rash.  Allergic/Immunologic: Negative  for immunocompromised state.  Hematological: Negative for adenopathy. Does not bruise/bleed easily.  Psychiatric/Behavioral: Negative for dysphoric mood and suicidal ideas.   Objective:  BP (!) 146/75 (BP Location: Left Arm, Patient Position: Sitting, Cuff Size: Large)   Pulse 82   Temp 98.8 F (37.1 C) (Oral)   Ht 5\' 3"  (1.6 m)   Wt 238 lb 3.2 oz (108 kg)   SpO2 92%   BMI 42.20 kg/m   BP/Weight 12/01/2015 06/26/2015 [id number]  Systolic BP 123456 [id number] 123456  Diastolic BP 75 72 85  Wt. (Lbs) 238.2 224 -  BMI 42.2 39.69 -   Physical Exam  Constitutional: She is oriented to person, place, and time. She appears well-developed and well-nourished. No distress.  Obese   HENT:  Head: Normocephalic and atraumatic.  Pulmonary/Chest: Effort normal.  Musculoskeletal: She exhibits no edema.       Right ankle: Achilles tendon normal.       Left ankle: Normal. Achilles tendon normal.       Feet:  Neurological: She is alert and oriented to person, place, and time.  Skin: Skin is warm and dry. No rash noted.  Psychiatric: She has a normal mood and affect.     Assessment & Plan:  Angelica Harvey was seen today for foot pain.  Diagnoses and all orders for this visit:  Plantar fasciitis of left foot   There are no diagnoses  linked to this encounter.  No orders of the defined types were placed in this encounter.   Follow-up: No Follow-up on file.   Boykin Nearing MD

## 2015-12-29 ENCOUNTER — Telehealth: Payer: Self-pay | Admitting: Family Medicine

## 2015-12-29 DIAGNOSIS — Z0000 Encounter for general adult medical examination without abnormal findings: Secondary | ICD-10-CM

## 2015-12-29 NOTE — Telephone Encounter (Signed)
Pt calling requesting a referral to be placed so she can get a colonoscopy  Pt stated this was discussed in last visit

## 2016-01-12 NOTE — Telephone Encounter (Signed)
Referral placed.

## 2016-02-04 ENCOUNTER — Encounter: Payer: Self-pay | Admitting: Internal Medicine

## 2016-02-23 ENCOUNTER — Ambulatory Visit: Payer: BLUE CROSS/BLUE SHIELD | Attending: Internal Medicine

## 2016-03-16 ENCOUNTER — Ambulatory Visit: Payer: BLUE CROSS/BLUE SHIELD | Admitting: *Deleted

## 2016-03-16 VITALS — Ht 63.0 in | Wt 245.0 lb

## 2016-03-16 DIAGNOSIS — Z1211 Encounter for screening for malignant neoplasm of colon: Secondary | ICD-10-CM

## 2016-03-16 MED ORDER — SUPREP BOWEL PREP KIT 17.5-3.13-1.6 GM/177ML PO SOLN: 1.0000 | Freq: Once | ORAL | 0 refills | Status: AC

## 2016-03-16 NOTE — Progress Notes (Signed)
Patient denies any allergies to egg or soy products. Patient denies complications with anesthesia/sedation.  Patient denies oxygen use at home and denies diet medications. Emmi instructions for colonoscopy  explained and patient denied.

## 2016-03-19 ENCOUNTER — Encounter: Payer: Self-pay | Admitting: Internal Medicine

## 2016-03-30 ENCOUNTER — Ambulatory Visit (AMBULATORY_SURGERY_CENTER): Payer: BLUE CROSS/BLUE SHIELD | Admitting: Internal Medicine

## 2016-03-30 ENCOUNTER — Encounter: Payer: Self-pay | Admitting: Internal Medicine

## 2016-03-30 VITALS — BP 154/80 | HR 73 | Temp 99.9°F | Resp 11 | Ht 63.0 in | Wt 245.0 lb

## 2016-03-30 DIAGNOSIS — Z1211 Encounter for screening for malignant neoplasm of colon: Secondary | ICD-10-CM | POA: Diagnosis present

## 2016-03-30 DIAGNOSIS — D124 Benign neoplasm of descending colon: Secondary | ICD-10-CM

## 2016-03-30 DIAGNOSIS — D123 Benign neoplasm of transverse colon: Secondary | ICD-10-CM | POA: Diagnosis not present

## 2016-03-30 DIAGNOSIS — Z1212 Encounter for screening for malignant neoplasm of rectum: Secondary | ICD-10-CM | POA: Diagnosis not present

## 2016-03-30 MED ORDER — SODIUM CHLORIDE 0.9 % IV SOLN: 500.0000 mL | INTRAVENOUS | Status: DC

## 2016-03-30 NOTE — Op Note (Signed)
[facility] Patient Name: Angelica Harvey Procedure Date: 03/30/2016 2:56 PM MRN: [id number] Endoscopist: Jerene Bears , MD Age: 65 Referring MD:  Date of Birth: Jul 18, 1950 Gender: Female Account #: [id number] Procedure:                Colonoscopy Indications:              Screening for colorectal malignant neoplasm, Last                            colonoscopy 10 years ago Medicines:                Monitored Anesthesia Care Procedure:                Pre-Anesthesia Assessment:                           - Prior to the procedure, a History and Physical                            was performed, and patient medications and                            allergies were reviewed. The patient's tolerance of                            previous anesthesia was also reviewed. The risks                            and benefits of the procedure and the sedation                            options and risks were discussed with the patient.                            All questions were answered, and informed consent                            was obtained. Prior Anticoagulants: The patient has                            taken no previous anticoagulant or antiplatelet                            agents. ASA Grade Assessment: III - A patient with                            severe systemic disease. After reviewing the risks                            and benefits, the patient was deemed in                            satisfactory condition to undergo the procedure.  After obtaining informed consent, the colonoscope                            was passed under direct vision. Throughout the                            procedure, the patient's blood pressure, pulse, and                            oxygen saturations were monitored continuously. The                            Model CF-HQ190L [id number]) scope was introduced                            through the anus and advanced to the  the cecum,                            identified by appendiceal orifice and ileocecal                            valve. The colonoscopy was performed without                            difficulty. The patient tolerated the procedure                            well. The quality of the bowel preparation was                            good. The ileocecal valve, appendiceal orifice, and                            rectum were photographed. Scope In: 3:18:32 PM Scope Out: 3:29:24 PM Scope Withdrawal Time: 0 hours 8 minutes 37 seconds  Total Procedure Duration: 0 hours 10 minutes 52 seconds  Findings:                 The perianal and digital rectal examinations were                            normal.                           A 3 mm polyp was found in the hepatic flexure. The                            polyp was sessile. The polyp was removed with a                            cold snare. Resection and retrieval were complete.                           Two sessile polyps were found in the descending  colon. The polyps were 5 to 6 mm in size. These                            polyps were removed with a cold snare. Resection                            and retrieval were complete.                           Multiple small-mouthed diverticula were found in                            the descending colon and splenic flexure.                           The retroflexed view of the distal rectum and anal                            verge was normal and showed no anal or rectal                            abnormalities. Complications:            No immediate complications. Estimated Blood Loss:     Estimated blood loss was minimal. Impression:               - One 3 mm polyp at the hepatic flexure, removed                            with a cold snare. Resected and retrieved.                           - Two 5 to 6 mm polyps in the descending colon,                            removed  with a cold snare. Resected and retrieved.                           - Diverticulosis in the descending colon and at the                            splenic flexure.                           - The distal rectum and anal verge are normal on                            retroflexion view. Recommendation:           - Patient has a contact number available for                            emergencies. The signs and symptoms of potential  delayed complications were discussed with the                            patient. Return to normal activities tomorrow.                            Written discharge instructions were provided to the                            patient.                           - Resume previous diet.                           - Continue present medications.                           - Await pathology results.                           - Repeat colonoscopy is recommended. The                            colonoscopy date will be determined after pathology                            results from today's exam become available for                            review. Jerene Bears, MD 03/30/2016 3:32:38 PM This report has been signed electronically.

## 2016-03-30 NOTE — Progress Notes (Signed)
Report to PACU, RN, vss, BBS= Clear.  

## 2016-03-30 NOTE — Patient Instructions (Signed)
YOU HAD AN ENDOSCOPIC PROCEDURE TODAY AT THE [facility]:   Refer to the procedure report that was given to you for any specific questions about what was found during the examination.  If the procedure report does not answer your questions, please call your gastroenterologist to clarify.  If you requested that your care partner not be given the details of your procedure findings, then the procedure report has been included in a sealed envelope for you to review at your convenience later.  YOU SHOULD EXPECT: Some feelings of bloating in the abdomen. Passage of more gas than usual.  Walking can help get rid of the air that was put into your GI tract during the procedure and reduce the bloating. If you had a lower endoscopy (such as a colonoscopy or flexible sigmoidoscopy) you may notice spotting of blood in your stool or on the toilet paper. If you underwent a bowel prep for your procedure, you may not have a normal bowel movement for a few days.  Please Note:  You might notice some irritation and congestion in your nose or some drainage.  This is from the oxygen used during your procedure.  There is no need for concern and it should clear up in a day or so.  SYMPTOMS TO REPORT IMMEDIATELY:   Following lower endoscopy (colonoscopy or flexible sigmoidoscopy):  Excessive amounts of blood in the stool  Significant tenderness or worsening of abdominal pains  Swelling of the abdomen that is new, acute  Fever of 100F or higher    For urgent or emergent issues, a gastroenterologist can be reached at any hour by calling [phone].   DIET:  We do recommend a small meal at first, but then you may proceed to your regular diet.  Drink plenty of fluids but you should avoid alcoholic beverages for 24 hours.  ACTIVITY:  You should plan to take it easy for the rest of today and you should NOT DRIVE or use heavy machinery until tomorrow (because of the sedation medicines used during the test).     FOLLOW UP: Our staff will call the number listed on your records the next business day following your procedure to check on you and address any questions or concerns that you may have regarding the information given to you following your procedure. If we do not reach you, we will leave a message.  However, if you are feeling well and you are not experiencing any problems, there is no need to return our call.  We will assume that you have returned to your regular daily activities without incident.  If any biopsies were taken you will be contacted by phone or by letter within the next 1-3 weeks.  Please call us at [phone] if you have not heard about the biopsies in 3 weeks.    SIGNATURES/CONFIDENTIALITY: You and/or your care partner have signed paperwork which will be entered into your electronic medical record.  These signatures attest to the fact that that the information above on your After Visit Summary has been reviewed and is understood.  Full responsibility of the confidentiality of this discharge information lies with you and/or your care-partner.   INFORMATION ON POLYPS  AND DIVERTICULOSIS GIVEN TO YOU TODAY 

## 2016-03-31 ENCOUNTER — Telehealth: Payer: Self-pay

## 2016-03-31 NOTE — Telephone Encounter (Signed)
No name or number identifier. Left a voicemail we will call back later.

## 2016-04-02 ENCOUNTER — Encounter: Payer: Self-pay | Admitting: Internal Medicine

## 2016-05-26 ENCOUNTER — Ambulatory Visit: Payer: Self-pay | Attending: Family Medicine

## 2016-08-25 ENCOUNTER — Encounter: Payer: Self-pay | Admitting: Family Medicine

## 2016-11-05 ENCOUNTER — Encounter (INDEPENDENT_AMBULATORY_CARE_PROVIDER_SITE_OTHER): Payer: Self-pay | Admitting: Ophthalmology

## 2017-01-12 ENCOUNTER — Encounter (INDEPENDENT_AMBULATORY_CARE_PROVIDER_SITE_OTHER): Payer: Self-pay | Admitting: Ophthalmology

## 2017-01-17 ENCOUNTER — Other Ambulatory Visit: Payer: Self-pay | Admitting: Family Medicine

## 2017-01-17 ENCOUNTER — Telehealth: Payer: Self-pay | Admitting: Family Medicine

## 2017-01-17 DIAGNOSIS — I10 Essential (primary) hypertension: Secondary | ICD-10-CM

## 2017-01-17 MED ORDER — LOSARTAN POTASSIUM 100 MG PO TABS: 100.0000 mg | ORAL_TABLET | Freq: Every day | ORAL | 1 refills | Status: DC

## 2017-01-17 NOTE — Telephone Encounter (Signed)
Pt called requesting medication refill on losartan (COZAAR) 100 MG tablet, pt has an appt scheduled to est care but is out of medication. Please f/up

## 2017-02-21 ENCOUNTER — Encounter (INDEPENDENT_AMBULATORY_CARE_PROVIDER_SITE_OTHER): Payer: Self-pay | Admitting: Ophthalmology

## 2017-03-07 ENCOUNTER — Encounter: Payer: Self-pay | Admitting: Internal Medicine

## 2017-03-07 ENCOUNTER — Ambulatory Visit: Payer: Medicare Other | Attending: Internal Medicine | Admitting: Internal Medicine

## 2017-03-07 VITALS — BP 148/78 | HR 76 | Temp 98.1°F | Resp 18 | Ht 63.0 in | Wt 245.0 lb

## 2017-03-07 DIAGNOSIS — M722 Plantar fascial fibromatosis: Secondary | ICD-10-CM | POA: Diagnosis not present

## 2017-03-07 DIAGNOSIS — E785 Hyperlipidemia, unspecified: Secondary | ICD-10-CM | POA: Diagnosis not present

## 2017-03-07 DIAGNOSIS — Z780 Asymptomatic menopausal state: Secondary | ICD-10-CM | POA: Insufficient documentation

## 2017-03-07 DIAGNOSIS — S40029A Contusion of unspecified upper arm, initial encounter: Secondary | ICD-10-CM | POA: Diagnosis not present

## 2017-03-07 DIAGNOSIS — S40022A Contusion of left upper arm, initial encounter: Secondary | ICD-10-CM | POA: Diagnosis not present

## 2017-03-07 DIAGNOSIS — Z87891 Personal history of nicotine dependence: Secondary | ICD-10-CM | POA: Diagnosis not present

## 2017-03-07 DIAGNOSIS — Z6841 Body Mass Index (BMI) 40.0 and over, adult: Secondary | ICD-10-CM

## 2017-03-07 DIAGNOSIS — H9313 Tinnitus, bilateral: Secondary | ICD-10-CM | POA: Diagnosis not present

## 2017-03-07 DIAGNOSIS — Z1231 Encounter for screening mammogram for malignant neoplasm of breast: Secondary | ICD-10-CM | POA: Diagnosis not present

## 2017-03-07 DIAGNOSIS — Z2821 Immunization not carried out because of patient refusal: Secondary | ICD-10-CM

## 2017-03-07 DIAGNOSIS — I10 Essential (primary) hypertension: Secondary | ICD-10-CM | POA: Diagnosis not present

## 2017-03-07 DIAGNOSIS — W010XXA Fall on same level from slipping, tripping and stumbling without subsequent striking against object, initial encounter: Secondary | ICD-10-CM | POA: Diagnosis not present

## 2017-03-07 DIAGNOSIS — Z1239 Encounter for other screening for malignant neoplasm of breast: Secondary | ICD-10-CM

## 2017-03-07 MED ORDER — LOSARTAN POTASSIUM 100 MG PO TABS: 100.0000 mg | ORAL_TABLET | Freq: Every day | ORAL | 6 refills | Status: DC

## 2017-03-07 MED ORDER — AMLODIPINE BESYLATE 5 MG PO TABS: 5.0000 mg | ORAL_TABLET | Freq: Every day | ORAL | 3 refills | Status: DC

## 2017-03-07 NOTE — Patient Instructions (Signed)
Your blood pressure is well controlled.  Continue Losartan. Start Amlodipine.  Work on increasing physical activity.  Follow a Healthy Eating Plan - You can do it! Limit sugary drinks.  Avoid sodas, sweet tea, sport or energy drinks, or fruit drinks.  Drink water, lo-fat milk, or diet drinks. Limit snack foods.   Cut back on candy, cake, cookies, chips, ice cream.  These are a special treat, only in small amounts. Eat plenty of vegetables.  Especially dark green, red, and orange vegetables. Aim for at least 3 servings a day. More is better! Include fruit in your daily diet.  Whole fruit is much healthier than fruit juice! Limit "white" bread, "white" pasta, "white" rice.   Choose "100% whole grain" products, brown or wild rice. Avoid fatty meats. Try "Meatless Monday" and choose eggs or beans one day a week.  When eating meat, choose lean meats like chicken, [place], and fish.  Grill, broil, or bake meats instead of frying, and eat poultry without the skin. Eat less salt.  Avoid frozen pizzas, frozen dinners and salty foods.  Use seasonings other than salt in cooking.  This can help blood pressure and keep you from swelling Beer, wine and liquor have calories.  If you can safely drink alcohol, limit to 1 drink per day for women, 2 drinks for men

## 2017-03-07 NOTE — Progress Notes (Signed)
Patient ID: Angelica Harvey, female    DOB: [birth year]  MRN: [id number]  CC: Hypertension   Subjective: Angelica Harvey is a 67 y.o. female who presents for chronic disease management.  Last saw Dr. Adrian Blackwater is 11/2015. Her concerns today include:  Pt with hx of HTN, HL, obesity and plantar fasciitis.   1. HTN: compliant with Losartan. Limits salt No device to check but plans to get one -no CP/SOB/LE edema  2. Fell 2 days ago when she got up in middle of night to go to bathroom. She tripped and break her fall with LT arm.  Sustained a large bruise.   3. Obesity:  -joined the gym but has not started yet. "I know I need to be more active." She tries to limit fast food. -"how to get rid of belly fat?"  4. C/o back ground humming in ears intermittently with dec hearing.  Spouse has told her many times that she turns the TV up too loud  Patient Active Problem List   Diagnosis Date Noted  . Plantar fasciitis of left foot 12/01/2015  . Insomnia 06/26/2015  . Severe obesity (BMI >= 40) ([place]) 01/23/2015  . GERD (gastroesophageal reflux disease) 08/22/2013  . Depression 06/20/2013  . Dyslipidemia 09/19/2012  . Essential hypertension 07/22/2008     Current Outpatient Medications on File Prior to Visit  Medication Sig Dispense Refill  . Lactobacillus (PROBIOTIC ACIDOPHILUS PO) Take by mouth daily.    . Multiple Vitamin (MULTIVITAMIN) tablet Take 1 tablet by mouth daily.     Current Facility-Administered Medications on File Prior to Visit  Medication Dose Route Frequency Provider Last Rate Last Dose  . 0.9 %  sodium chloride infusion  500 mL Intravenous Continuous Pyrtle, Lajuan Lines, MD        Allergies  Allergen Reactions  . Belladonna     REACTION: blurred vision/buring   . Hctz [Hydrochlorothiazide]     Pain in abd  . Phenobarbital     REACTION: rash on hands and feet, water retention  . Quinine     REACTION: rash    Social History   Socioeconomic History  . Marital status:  Married    Spouse name: Not on file  . Number of children: 1  . Years of education: Not on file  . Highest education level: Not on file  Social Needs  . Financial resource strain: Not on file  . Food insecurity - worry: Not on file  . Food insecurity - inability: Not on file  . Transportation needs - medical: Not on file  . Transportation needs - non-medical: Not on file  Occupational History  . Occupation: retired  Tobacco Use  . Smoking status: Former Smoker    Packs/day: 1.00    Years: 17.00    Pack years: 17.00    Types: Cigarettes    Last attempt to quit: 04/12/1990    Years since quitting: 26.9  . Smokeless tobacco: Never Used  Substance and Sexual Activity  . Alcohol use: Yes    Comment: ocassional wine   . Drug use: No  . Sexual activity: Not on file  Other Topics Concern  . Not on file  Social History Narrative  . Not on file    Family History  Problem Relation Age of Onset  . Heart attack Father   . Heart disease Father        CABG  . Colon polyps Mother   . Kidney disease Sister  dialysis  . Colon cancer Maternal Aunt        x 2  . Esophageal cancer Neg Hx   . Stomach cancer Neg Hx   . Rectal cancer Neg Hx     Past Surgical History:  Procedure Laterality Date  . cataract surgery Bilateral   . CESAREAN SECTION     x 1  . COLONOSCOPY  08/2005   Olevia Perches- hx polyps    ROS: Review of Systems Negative except as stated above PHYSICAL EXAM: BP (!) 148/78 (BP Location: Left Arm, Patient Position: Sitting, Cuff Size: Normal)   Pulse 76   Temp 98.1 F (36.7 C) (Oral)   Resp 18   Ht 5\' 3"  (1.6 m)   Wt 245 lb (111.1 kg)   SpO2 95%   BMI 43.40 kg/m   Wt Readings from Last 3 Encounters:  03/07/17 245 lb (111.1 kg)  03/30/16 245 lb (111.1 kg)  03/16/16 245 lb (111.1 kg)   BP 160/82  Physical Exam  General appearance - alert, well appearing, obese Caucasian female and in no distress Mental status - alert, oriented to person, place, and  time, normal mood, behavior, speech, dress, motor activity, and thought processes Ears - bilateral TM's and external ear canals normal Nose - normal and patent, no erythema, discharge or polyps Neck - supple, no significant adenopathy Chest - clear to auscultation, no wheezes, rales or rhonchi, symmetric air entry Heart - normal rate, regular rhythm, normal S1, S2, no murmurs, rubs, clicks or gallops Extremities - peripheral pulses normal, no pedal edema, no clubbing or cyanosis Skin: moder size resolving ecchymosis LT upper arm over mid bicep. Nontender ASSESSMENT AND PLAN: 1. Essential hypertension -Not at goal. Continue Cozaar.  Add Norvasc. - CBC - Comprehensive metabolic panel - Lipid panel - amLODipine (NORVASC) 5 MG tablet; Take 1 tablet (5 mg total) by mouth daily.  Dispense: 90 tablet; Refill: 3 - losartan (COZAAR) 100 MG tablet; Take 1 tablet (100 mg total) by mouth daily.  Dispense: 30 tablet; Refill: 6 - Magnesium  2. Class 3 severe obesity due to excess calories without serious comorbidity with body mass index (BMI) of 40.0 to 44.9 in adult [facility]) -Discussed the importance of healthy eating habits and regular exercise.  Advise patient to limit portion sizes by using smaller plate, decrease white carbohydrates and drink water instead of sweet drinks. Encourage exercise 3-4 times a week for 30 minutes. printed information given on healthy eating habits  3. Arm bruise, left, initial encounter -Resolving.  Nothing further needs to be done at this time  4. Tinnitus of both ears - Ambulatory referral to ENT  5. Breast cancer screening - MM Digital Screening; Future  6. Postmenopausal state - DG Bone Density; Future  7. Influenza vaccination declined   Patient was given the opportunity to ask questions.  Patient verbalized understanding of the plan and was able to repeat key elements of the plan.   Orders Placed This Encounter  Procedures  . MM Digital Screening  . DG  Bone Density  . CBC  . Comprehensive metabolic panel  . Lipid panel  . Magnesium  . Ambulatory referral to ENT     Requested Prescriptions   Signed Prescriptions Disp Refills  . amLODipine (NORVASC) 5 MG tablet 90 tablet 3    Sig: Take 1 tablet (5 mg total) by mouth daily.  Marland Kitchen losartan (COZAAR) 100 MG tablet 30 tablet 6    Sig: Take 1 tablet (100 mg total) by mouth  daily.    Return in about 3 months (around 06/07/2017).  Karle Plumber, MD, FACP

## 2017-03-08 LAB — COMPREHENSIVE METABOLIC PANEL
A/G RATIO: 1.9 (ref 1.2–2.2)
ALK PHOS: 103 IU/L (ref 39–117)
ALT: 27 IU/L (ref 0–32)
AST: 24 IU/L (ref 0–40)
Albumin: 4.4 g/dL (ref 3.6–4.8)
BILIRUBIN TOTAL: 0.3 mg/dL (ref 0.0–1.2)
BUN/Creatinine Ratio: 13 (ref 12–28)
BUN: 11 mg/dL (ref 8–27)
CHLORIDE: 102 mmol/L (ref 96–106)
CO2: 29 mmol/L (ref 20–29)
Calcium: 9.7 mg/dL (ref 8.7–10.3)
Creatinine, Ser: 0.83 mg/dL (ref 0.57–1.00)
GFR calc Af Amer: 86 mL/min/{1.73_m2} (ref >59)
GFR calc non Af Amer: 74 mL/min/{1.73_m2} (ref >59)
Globulin, Total: 2.3 g/dL (ref 1.5–4.5)
Glucose: 77 mg/dL (ref 65–99)
POTASSIUM: 5.2 mmol/L (ref 3.5–5.2)
SODIUM: 143 mmol/L (ref 134–144)
Total Protein: 6.7 g/dL (ref 6.0–8.5)

## 2017-03-08 LAB — CBC
HEMATOCRIT: 41.8 % (ref 34.0–46.6)
HEMOGLOBIN: 13.9 g/dL (ref 11.1–15.9)
MCH: 27.9 pg (ref 26.6–33.0)
MCHC: 33.3 g/dL (ref 31.5–35.7)
MCV: 84 fL (ref 79–97)
PLATELETS: 228 10*3/uL (ref 150–379)
RBC: 4.98 x10E6/uL (ref 3.77–5.28)
RDW: 14.9 % (ref 12.3–15.4)
WBC: 7.5 10*3/uL (ref 3.4–10.8)

## 2017-03-08 LAB — LIPID PANEL
Chol/HDL Ratio: 5.7 ratio — ABNORMAL HIGH (ref 0.0–4.4)
Cholesterol, Total: 218 mg/dL — ABNORMAL HIGH (ref 100–199)
HDL: 38 mg/dL — AB (ref >39)
LDL Calculated: 142 mg/dL — ABNORMAL HIGH (ref 0–99)
TRIGLYCERIDES: 191 mg/dL — AB (ref 0–149)
VLDL Cholesterol Cal: 38 mg/dL (ref 5–40)

## 2017-03-08 LAB — MAGNESIUM: MAGNESIUM: 2.1 mg/dL (ref 1.6–2.3)

## 2017-03-09 ENCOUNTER — Encounter: Payer: Self-pay | Admitting: Internal Medicine

## 2017-03-09 NOTE — Progress Notes (Signed)
Lab results reviewed. ASCVD risk score is 21%. I recommend starting Lipitor 10 mg daily. Results sent to pt via Mychart. Results for orders placed or performed in visit on 03/07/17  CBC  Result Value Ref Range   WBC 7.5 3.4 - 10.8 x10E3/uL   RBC 4.98 3.77 - 5.28 x10E6/uL   Hemoglobin 13.9 11.1 - 15.9 g/dL   Hematocrit 41.8 34.0 - 46.6 %   MCV 84 79 - 97 fL   MCH 27.9 26.6 - 33.0 pg   MCHC 33.3 31.5 - 35.7 g/dL   RDW 14.9 12.3 - 15.4 %   Platelets 228 150 - 379 x10E3/uL  Comprehensive metabolic panel  Result Value Ref Range   Glucose 77 65 - 99 mg/dL   BUN 11 8 - 27 mg/dL   Creatinine, Ser 0.83 0.57 - 1.00 mg/dL   GFR calc non Af Amer 74 >59 mL/min/1.73   GFR calc Af Amer 86 >59 mL/min/1.73   BUN/Creatinine Ratio 13 12 - 28   Sodium 143 134 - 144 mmol/L   Potassium 5.2 3.5 - 5.2 mmol/L   Chloride 102 96 - 106 mmol/L   CO2 29 20 - 29 mmol/L   Calcium 9.7 8.7 - 10.3 mg/dL   Total Protein 6.7 6.0 - 8.5 g/dL   Albumin 4.4 3.6 - 4.8 g/dL   Globulin, Total 2.3 1.5 - 4.5 g/dL   Albumin/Globulin Ratio 1.9 1.2 - 2.2   Bilirubin Total 0.3 0.0 - 1.2 mg/dL   Alkaline Phosphatase 103 39 - 117 IU/L   AST 24 0 - 40 IU/L   ALT 27 0 - 32 IU/L  Lipid panel  Result Value Ref Range   Cholesterol, Total 218 (H) 100 - 199 mg/dL   Triglycerides 191 (H) 0 - 149 mg/dL   HDL 38 (L) >39 mg/dL   VLDL Cholesterol Cal 38 5 - 40 mg/dL   LDL Calculated 142 (H) 0 - 99 mg/dL   Chol/HDL Ratio 5.7 (H) 0.0 - 4.4 ratio  Magnesium  Result Value Ref Range   Magnesium 2.1 1.6 - 2.3 mg/dL

## 2017-03-15 DIAGNOSIS — H35372 Puckering of macula, left eye: Secondary | ICD-10-CM | POA: Diagnosis not present

## 2017-03-15 DIAGNOSIS — H43821 Vitreomacular adhesion, right eye: Secondary | ICD-10-CM | POA: Diagnosis not present

## 2017-03-15 DIAGNOSIS — H43812 Vitreous degeneration, left eye: Secondary | ICD-10-CM | POA: Diagnosis not present

## 2017-04-06 ENCOUNTER — Ambulatory Visit: Payer: Medicare Other

## 2017-04-06 ENCOUNTER — Other Ambulatory Visit: Payer: Medicare Other

## 2017-06-23 DIAGNOSIS — H5213 Myopia, bilateral: Secondary | ICD-10-CM | POA: Diagnosis not present

## 2017-06-29 ENCOUNTER — Encounter (HOSPITAL_COMMUNITY): Payer: Self-pay | Admitting: *Deleted

## 2017-06-29 ENCOUNTER — Emergency Department (HOSPITAL_COMMUNITY): Payer: No Typology Code available for payment source

## 2017-06-29 ENCOUNTER — Emergency Department (HOSPITAL_COMMUNITY)
Admission: EM | Admit: 2017-06-29 | Discharge: 2017-06-29 | Disposition: A | Discharge status: Home / Self Care | Payer: No Typology Code available for payment source | Attending: Emergency Medicine | Admitting: Emergency Medicine

## 2017-06-29 DIAGNOSIS — R51 Headache: Secondary | ICD-10-CM | POA: Diagnosis not present

## 2017-06-29 DIAGNOSIS — M542 Cervicalgia: Secondary | ICD-10-CM | POA: Insufficient documentation

## 2017-06-29 DIAGNOSIS — R519 Headache, unspecified: Secondary | ICD-10-CM

## 2017-06-29 DIAGNOSIS — Z87891 Personal history of nicotine dependence: Secondary | ICD-10-CM | POA: Insufficient documentation

## 2017-06-29 DIAGNOSIS — I10 Essential (primary) hypertension: Secondary | ICD-10-CM | POA: Insufficient documentation

## 2017-06-29 DIAGNOSIS — Z79899 Other long term (current) drug therapy: Secondary | ICD-10-CM | POA: Insufficient documentation
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
## 2017-06-29 NOTE — Discharge Instructions (Signed)
Please read instructions below. Apply ice to your areas of pain for 20 minutes at a time. You can take 600 mg of Advil/ibuprofen every 6 hours as needed for pain. Schedule an appointment with your primary care provider to follow up on your visit today and discuss your incidental findings from your head CT. Return to the ER for sudden severely worsening headache, vision changes, vomiting, if new numbness or tingling in your arms or legs, inability to urinate, inability to hold your bowels, or weakness in your extremities.
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
## 2017-06-29 NOTE — ED Notes (Signed)
Patient verbalized understanding of discharge instructions and denies any further needs or questions at this time. VS stable. Patient ambulatory with steady gait.  

## 2017-07-01 ENCOUNTER — Other Ambulatory Visit: Payer: Self-pay

## 2017-07-07 DIAGNOSIS — H5203 Hypermetropia, bilateral: Secondary | ICD-10-CM | POA: Diagnosis not present

## 2017-10-14 ENCOUNTER — Encounter: Payer: Self-pay | Admitting: Internal Medicine

## 2017-10-14 ENCOUNTER — Ambulatory Visit: Payer: Medicare Other | Attending: Internal Medicine | Admitting: Internal Medicine

## 2017-10-14 VITALS — BP 130/72 | HR 88 | Temp 97.9°F | Resp 18 | Ht 62.0 in | Wt 247.0 lb

## 2017-10-14 DIAGNOSIS — Z2821 Immunization not carried out because of patient refusal: Secondary | ICD-10-CM

## 2017-10-14 DIAGNOSIS — Z6841 Body Mass Index (BMI) 40.0 and over, adult: Secondary | ICD-10-CM

## 2017-10-14 DIAGNOSIS — E785 Hyperlipidemia, unspecified: Secondary | ICD-10-CM | POA: Diagnosis not present

## 2017-10-14 DIAGNOSIS — Z87891 Personal history of nicotine dependence: Secondary | ICD-10-CM | POA: Insufficient documentation

## 2017-10-14 DIAGNOSIS — G939 Disorder of brain, unspecified: Secondary | ICD-10-CM

## 2017-10-14 DIAGNOSIS — M722 Plantar fascial fibromatosis: Secondary | ICD-10-CM | POA: Insufficient documentation

## 2017-10-14 DIAGNOSIS — G9389 Other specified disorders of brain: Secondary | ICD-10-CM

## 2017-10-14 DIAGNOSIS — I10 Essential (primary) hypertension: Secondary | ICD-10-CM | POA: Diagnosis not present
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
## 2017-10-14 NOTE — Patient Instructions (Signed)

## 2017-10-17 ENCOUNTER — Ambulatory Visit: Payer: No Typology Code available for payment source | Attending: Internal Medicine | Admitting: Pharmacist

## 2017-10-17 ENCOUNTER — Encounter: Payer: Self-pay | Admitting: Pharmacist

## 2017-10-17 VITALS — BP 159/84 | HR 80 | Temp 98.6°F | Ht 62.0 in | Wt 248.8 lb

## 2017-10-17 DIAGNOSIS — Z0000 Encounter for general adult medical examination without abnormal findings: Secondary | ICD-10-CM | POA: Diagnosis not present

## 2017-10-17 NOTE — Patient Instructions (Signed)
Thank you for coming to see me today.   We saw you for your Annual Wellness Visit today. This was basically a check-up that helps [place] keep your medical information updated.   I will forward this information to your primary care doctor, Dr. Wynetta Emery. She will look this over and follow-up with you concerning any additional steps to help increase your wellness. These steps could include setting new goals, getting certain vaccines, receiving appropriate screenings, or making steps to address concerns identified during this wellness visit.   Things that you should discuss with your doctor at your next visit include:    1. Increasing your physical activity/exercise and diet to help reduce cholesterol and lose weight. 2. Managing depression 3. Reducing your heart disease risk with cholesterol medications 4. Receiving different vaccines 5. Receiving recommended mammograms 6. Receiving a scan to determine your risk of osteoporosis.   If you have any questions about medications, how to use them, or medication side effects please let me know. I can schedule you an appointment to help you manage your medication list.   If anything happens at home and you need to see your doctor, please call the clinic.

## 2017-10-17 NOTE — Progress Notes (Addendum)
Subjective:   Angelica Harvey is a 67 y.o. female who presents for Medicare Annual (Subsequent) preventive examination.     Objective:     Vitals: BP (!) 159/84   Pulse 80   Temp 98.6 F (37 C)   Ht 5\' 2"  (1.575 m)   Wt 248 lb 12.8 oz (112.9 kg)   BMI 45.51 kg/m   Body mass index is 45.51 kg/m.  Advanced Directives 10/17/2017 10/17/2017 10/17/2017 10/17/2017 03/16/2016 12/01/2015 06/26/2015  Does Patient Have a Medical Advance Directive? No No No Yes No No No  Does patient want to make changes to medical advance directive? Yes (MAU/Ambulatory/Procedural Areas - Information given) No - Patient declined - - - - -  Would patient like information on creating a medical advance directive? Yes (MAU/Ambulatory/Procedural Areas - Information given) Yes (MAU/Ambulatory/Procedural Areas - Information given) - - - No - patient declined information -    Tobacco Social History   Tobacco Use  Smoking Status Former Smoker  . Packs/day: 1.00  . Years: 17.00  . Pack years: 17.00  . Types: Cigarettes  . Last attempt to quit: 04/12/1990  . Years since quitting: 27.5  Smokeless Tobacco Never Used     Counseling given: no - patient is not currently smoking  Clinical Intake:  Pre-visit preparation completed: No  Pain : No/denies pain Pain Score: 0-No pain  BMI - recorded: 45.51 Nutritional Status: BMI > 30  Obese Nutritional Risks: None Diabetes: No  How often do you need to have someone help you when you read instructions, pamphlets, or other written materials from your doctor or pharmacy?: 1 - Never What is the last grade level you completed in school?: 12th - Grad 1971  Interpreter Needed?: No  Past Medical History:  Diagnosis Date  . Anxiety   . Cataract    surgery - bilateral  . Colon polyps   . Depression   . History of hypokalemia   . Hypercholesterolemia    diet controlled - no meds  . Hypertension   . Obesity   . Pneumonia    Past Surgical History:  Procedure Laterality  Date  . cataract surgery Bilateral   . CESAREAN SECTION     x 1  . COLONOSCOPY  08/2005   Olevia Perches- hx polyps  . EYE SURGERY Bilateral    Cataract surgery    Family History  Problem Relation Age of Onset  . Heart attack Father   . Heart disease Father        CABG  . Colon polyps Mother   . Kidney disease Sister        dialysis  . Colon cancer Maternal Aunt        x 2  . Esophageal cancer Neg Hx   . Stomach cancer Neg Hx   . Rectal cancer Neg Hx    Social History   Socioeconomic History  . Marital status: Married    Spouse name: Not on file  . Number of children: 1  . Years of education: Not on file  . Highest education level: Not on file  Occupational History  . Occupation: retired  Scientific laboratory technician  . Financial resource strain: Not hard at all  . Food insecurity:    Worry: Never true    Inability: Never true  . Transportation needs:    Medical: No    Non-medical: No  Tobacco Use  . Smoking status: Former Smoker    Packs/day: 1.00    Years: 17.00  Pack years: 17.00    Types: Cigarettes    Last attempt to quit: 04/12/1990    Years since quitting: 27.5  . Smokeless tobacco: Never Used  Substance and Sexual Activity  . Alcohol use: Yes    Comment: ocassional wine   . Drug use: No  . Sexual activity: Not on file  Lifestyle  . Physical activity:    Days per week: Patient refused    Minutes per session: Patient refused  . Stress: Rather much  Relationships  . Social connections:    Talks on phone: Patient refused    Gets together: Patient refused    Attends religious service: Patient refused    Active member of club or organization: Patient refused    Attends meetings of clubs or organizations: Patient refused    Relationship status: Married  Other Topics Concern  . Not on file  Social History Narrative  . Not on file    Outpatient Encounter Medications as of 10/17/2017  Medication Sig  . Lactobacillus (PROBIOTIC ACIDOPHILUS PO) Take by mouth daily.  Marland Kitchen  losartan (COZAAR) 100 MG tablet Take 1 tablet (100 mg total) by mouth daily.  . Multiple Vitamin (MULTIVITAMIN) tablet Take 1 tablet by mouth daily.  . [DISCONTINUED] amLODipine (NORVASC) 5 MG tablet Take 1 tablet (5 mg total) by mouth daily. (Patient not taking: Reported on 10/14/2017)   Facility-Administered Encounter Medications as of 10/17/2017  Medication  . 0.9 %  sodium chloride infusion    Activities of Daily Living In your present state of health, do you have any difficulty performing the following activities: 10/17/2017  Hearing? N  Vision? N  Difficulty concentrating or making decisions? N  Walking or climbing stairs? N  Dressing or bathing? N  Doing errands, shopping? N  Preparing Food and eating ? N  Using the Toilet? N  In the past six months, have you accidently leaked urine? N  Do you have problems with loss of bowel control? N  Managing your Medications? N  Managing your Finances? N  Housekeeping or managing your Housekeeping? N  Some recent data might be hidden    Patient Care Team: Ladell Pier, MD as PCP - General (Internal Medicine)    Assessment:   This is a routine wellness examination for Angelica Harvey.  Exercise Activities and Dietary recommendations Current Exercise Habits: The patient does not participate in regular exercise at present, Exercise limited by: None identified  Counseling given. Recommended to patient that she completes ~150 mins/wk of aerobic exercise. Patient is motivated to lose weight and control cholesterol via diet.   Goals    . Blood Pressure < 140/90       Fall Risk Fall Risk  10/17/2017 10/17/2017 10/17/2017 10/14/2017 03/08/2017  Falls in the past year? No No No No No  Number falls in past yr: - - - - -  Injury with Fall? - - - - -  Risk Factor Category  - - - - -  Risk for fall due to : - - - - -  Follow up - - - - -   Is the patient's home free of loose throw rugs in walkways, pet beds, electrical cords, etc?   yes      Grab bars  in the bathroom? no      Handrails on the stairs?   yes      Adequate lighting?   yes  Depression Screen PHQ 2/9 Scores 10/17/2017 10/17/2017 10/14/2017 03/08/2017  PHQ - 2 Score  2 2 1  0  PHQ- 9 Score 6 6 4 4   Exception Documentation - - - -     Cognitive Function MMSE - Mini Mental State Exam 10/17/2017  Orientation to time 5  Orientation to Place 5  Registration 3  Attention/ Calculation 5  Recall 3  Language- name 2 objects 2  Language- repeat 1  Language- follow 3 step command 3  Language- read & follow direction 1  Write a sentence 1  Copy design 1  Total score 30    Immunization History  Administered Date(s) Administered  . Tdap 06/26/2015   Qualifies for Shingles Vaccine? yes  Screening Tests Health Maintenance  Topic Date Due  . Hepatitis C Screening  11/29/1950  . MAMMOGRAM  03/14/2016  . DEXA SCAN  10/15/2016  . PNA vac Low Risk Adult (1 of 2 - PCV13) 10/15/2018 (Originally 10/15/2016)  . INFLUENZA VACCINE  11/10/2017  . COLONOSCOPY  03/30/2021  . TETANUS/TDAP  06/25/2025    Cancer Screenings: Lung: Low Dose CT Chest recommended if Age 25-80 years, 30 pack-year currently smoking OR have quit w/in 15years. Patient does not qualify. Breast:  Up to date on Mammogram? No   Up to date of Bone Density/Dexa? No Colorectal: Next colonoscopy due in 2020  Additional Screenings: -per charts, has not been screened for Hep C    Plan:  Angelica Harvey is a 67 year old female here for Medicare Wellness. All information discussed and opportunities for questions allowed. Appropriate counseling and recommendations for PCP follow-up given to patient. I have listed recommendations at the bottom of the following list.    I have personally reviewed and noted the following in the patient's chart:   . Medical and social history . Use of alcohol, tobacco or illicit drugs  . Current medications  . Functional ability and status . Nutritional status . Physical activity . Advanced  directives . Hospitalizations, surgeries, and ER visits in previous 12 months . Vitals . Screenings to include cognitive, depression, and falls . Referrals and appointments - Referrals and appointments made by PCP. I am documenting responses from patient and giving appropriate recommendations to PCP for follow-up.  Marland Kitchen Recommendations: diet and physical activity for weight loss, statin use and ASCVD prevention, pneumococcal and zoster vaccinations, mammogram, Hep C screen, bone-density screen, depression management  In addition, I have reviewed and discussed with patient certain preventive protocols, quality metrics, and best practice recommendations. A written personalized care plan for preventive services as well as general preventive health recommendations were provided to patient.  [place], RPH-CPP  10/17/2017

## 2017-10-21 ENCOUNTER — Telehealth: Payer: Self-pay

## 2017-10-21 ENCOUNTER — Other Ambulatory Visit: Payer: Self-pay

## 2017-10-21 DIAGNOSIS — I10 Essential (primary) hypertension: Secondary | ICD-10-CM

## 2017-10-21 NOTE — Telephone Encounter (Signed)
Went on [place] and [employer] to do prior Auth for MRI Brain w/wo contrast  [facility] stated pt doesn't require PA for imaging and [facility] stated the same thing.  Contacted scheduling department and scheduled MRI for July 18 @3pm . Pt is to arrive at 230pm @[facility]   Pt is aware of appointment information and doesn't have any questions or concerns

## 2017-10-27 ENCOUNTER — Ambulatory Visit (HOSPITAL_COMMUNITY)
Admission: RE | Admit: 2017-10-27 | Discharge: 2017-10-27 | Disposition: A | Discharge status: Home / Self Care | Payer: Medicare Other | Source: Ambulatory Visit | Attending: Internal Medicine | Admitting: Internal Medicine

## 2017-10-27 DIAGNOSIS — D320 Benign neoplasm of cerebral meninges: Secondary | ICD-10-CM | POA: Diagnosis not present

## 2017-10-27 DIAGNOSIS — G939 Disorder of brain, unspecified: Secondary | ICD-10-CM | POA: Diagnosis not present

## 2017-10-27 DIAGNOSIS — G9389 Other specified disorders of brain: Secondary | ICD-10-CM

## 2017-10-27 LAB — CREATININE, SERUM
Creatinine, Ser: 1.03 mg/dL — ABNORMAL HIGH (ref 0.44–1.00)
GFR calc non Af Amer: 55 mL/min — ABNORMAL LOW (ref >60)

## 2017-10-27 MED ORDER — GADOBENATE DIMEGLUMINE 529 MG/ML IV SOLN
20.0000 mL | Freq: Once | INTRAVENOUS | Status: AC
  Administered 2017-10-27: 20 mL via INTRAVENOUS

## 2017-10-29 ENCOUNTER — Telehealth: Payer: Self-pay | Admitting: Internal Medicine

## 2017-10-29 DIAGNOSIS — D329 Benign neoplasm of meninges, unspecified: Secondary | ICD-10-CM

## 2017-10-29 NOTE — Telephone Encounter (Signed)
PC placed to pt today to discuss results of MRI of head.  Pt informed that it confirms that the lesion seen on RT side of brain is a meningioma. Pt told that usually we observe these when pt are asymptomatic and if tumor not invading brain or bone tissue.  However, since the MCA passes close and just posterior to her tumor and causing some mass-effect upon RT temporal lobe, I recommend  neurosurgeon eval.  Pt agreeable to referral.

## 2017-11-14 DIAGNOSIS — D329 Benign neoplasm of meninges, unspecified: Secondary | ICD-10-CM | POA: Diagnosis not present

## 2017-11-15 ENCOUNTER — Telehealth: Payer: Self-pay | Admitting: Internal Medicine

## 2017-11-15 ENCOUNTER — Other Ambulatory Visit: Payer: Self-pay | Admitting: Neurosurgery

## 2017-11-15 NOTE — Telephone Encounter (Signed)
Patient called wanting to speak to her provider because she wants to know if she can take some different types of vitamins. Please follow up with patient.

## 2017-11-15 NOTE — Telephone Encounter (Signed)
Contacted pt to see what vitamins she was wanting to take. Pt states she received a magazine from her insurance so she can get free vitamins. Per pt the vitamins she was wanting is the century senior 50+ it is compared to [employer]. Pt is also wanting to know would you like her to take vit B and Q10. Pt states she aslo seen flaxseed 1000mg , omega 3 fish oil, Krill Oil 350mg , Vit C 500mg  chewable and Vit B 50 complex.    Pt is wanting to know which vitamins will you be okay with her taking

## 2017-11-16 DIAGNOSIS — H903 Sensorineural hearing loss, bilateral: Secondary | ICD-10-CM | POA: Diagnosis not present

## 2017-11-16 DIAGNOSIS — H9313 Tinnitus, bilateral: Secondary | ICD-10-CM | POA: Diagnosis not present

## 2017-11-17 NOTE — Telephone Encounter (Signed)
Contacted pt to go over Dr. Johnson response pt is aware and doesn't have any questions or concerns  

## 2017-11-18 ENCOUNTER — Other Ambulatory Visit: Payer: Self-pay

## 2017-11-18 ENCOUNTER — Telehealth: Payer: Self-pay | Admitting: Internal Medicine

## 2017-11-18 DIAGNOSIS — I10 Essential (primary) hypertension: Secondary | ICD-10-CM

## 2017-11-18 MED ORDER — LOSARTAN POTASSIUM 100 MG PO TABS: 100.0000 mg | ORAL_TABLET | Freq: Every day | ORAL | 1 refills | Status: AC

## 2017-11-18 MED ORDER — LOSARTAN POTASSIUM 100 MG PO TABS: 100.0000 mg | ORAL_TABLET | Freq: Every day | ORAL | 6 refills | Status: DC

## 2017-11-18 NOTE — Telephone Encounter (Signed)
Rx has been sent pt pharmacy for 90 supply

## 2017-11-18 NOTE — Telephone Encounter (Signed)
Patient called requesting 90 day supply for losartan (COZAAR) 100 MG tablet [[id number]] instead of the 30 day supply because of upcoming surgery, please follow up with patient.

## 2017-11-23 ENCOUNTER — Other Ambulatory Visit: Payer: Self-pay | Admitting: Neurosurgery

## 2017-11-24 ENCOUNTER — Encounter (HOSPITAL_COMMUNITY): Payer: Self-pay

## 2017-11-24 ENCOUNTER — Other Ambulatory Visit: Payer: Self-pay

## 2017-11-24 ENCOUNTER — Encounter (HOSPITAL_COMMUNITY)
Admission: RE | Admit: 2017-11-24 | Discharge: 2017-11-24 | Disposition: A | Discharge status: Home / Self Care | Payer: Medicare Other | Source: Ambulatory Visit | Attending: Neurosurgery | Admitting: Neurosurgery

## 2017-11-24 DIAGNOSIS — D320 Benign neoplasm of cerebral meninges: Secondary | ICD-10-CM | POA: Diagnosis not present

## 2017-11-24 DIAGNOSIS — Z6841 Body Mass Index (BMI) 40.0 and over, adult: Secondary | ICD-10-CM | POA: Diagnosis not present

## 2017-11-24 DIAGNOSIS — Z87891 Personal history of nicotine dependence: Secondary | ICD-10-CM | POA: Insufficient documentation

## 2017-11-24 DIAGNOSIS — I10 Essential (primary) hypertension: Secondary | ICD-10-CM | POA: Insufficient documentation

## 2017-11-24 DIAGNOSIS — E7800 Pure hypercholesterolemia, unspecified: Secondary | ICD-10-CM | POA: Diagnosis not present

## 2017-11-24 DIAGNOSIS — Z9889 Other specified postprocedural states: Secondary | ICD-10-CM | POA: Insufficient documentation

## 2017-11-24 DIAGNOSIS — Z01812 Encounter for preprocedural laboratory examination: Secondary | ICD-10-CM | POA: Diagnosis not present

## 2017-11-24 LAB — BASIC METABOLIC PANEL
Anion gap: 7 (ref 5–15)
BUN: 11 mg/dL (ref 8–23)
CALCIUM: 8.6 mg/dL — AB (ref 8.9–10.3)
CO2: 29 mmol/L (ref 22–32)
CREATININE: 0.99 mg/dL (ref 0.44–1.00)
Chloride: 104 mmol/L (ref 98–111)
GFR calc Af Amer: >60 mL/min (ref >60)
GFR, EST NON AFRICAN AMERICAN: 58 mL/min — AB (ref >60)
Glucose, Bld: 127 mg/dL — ABNORMAL HIGH (ref 70–99)
Potassium: 3.8 mmol/L (ref 3.5–5.1)
SODIUM: 140 mmol/L (ref 135–145)

## 2017-11-24 LAB — TYPE AND SCREEN
ABO/RH(D): A POS
Antibody Screen: NEGATIVE

## 2017-11-24 LAB — CBC
HCT: 44.5 % (ref 36.0–46.0)
Hemoglobin: 13.5 g/dL (ref 12.0–15.0)
MCH: 27.8 pg (ref 26.0–34.0)
MCHC: 30.3 g/dL (ref 30.0–36.0)
MCV: 91.6 fL (ref 78.0–100.0)
PLATELETS: 214 10*3/uL (ref 150–400)
RBC: 4.86 MIL/uL (ref 3.87–5.11)
RDW: 13.8 % (ref 11.5–15.5)
WBC: 8.6 10*3/uL (ref 4.0–10.5)

## 2017-11-24 LAB — ABO/RH: ABO/RH(D): A POS

## 2017-11-24 NOTE — Progress Notes (Addendum)
PCP: Karle Plumber,  MD  Cardiologist: pt denies  EKG: obtained today  Stress test: pt denies  ECHO: pt denies  Cardiac Cath: pt denies  Chest x-ray: pt denies

## 2017-11-24 NOTE — Pre-Procedure Instructions (Signed)
LOREEN BANKSON  11/24/2017      [address] [place] [address] Phone: [phone] Fax: [phone]    Your procedure is scheduled on December 02, 2017.  Report to [facility] Admitting at 930 AM.  Call this number if you have problems the morning of surgery:  [phone]   Remember:  Do not eat or drink after midnight.    Take these medicines the morning of surgery with A SIP OF WATER  (none)  7 days prior to surgery STOP taking any Aspirin (unless otherwise instructed by your surgeon), Aleve, Naproxen, Ibuprofen, Motrin, Advil, Goody's, BC's, all herbal medications, fish oil, and all vitamins    Do not wear jewelry, make-up or nail polish.  Do not wear lotions, powders, or perfumes, or deodorant.  Do not shave 48 hours prior to surgery.    Do not bring valuables to the hospital.  [facility] is not responsible for any belongings or valuables.  Contacts, dentures or bridgework may not be worn into surgery.  Leave your suitcase in the car.  After surgery it may be brought to your room.  For patients admitted to the hospital, discharge time will be determined by your treatment team.  Patients discharged the day of surgery will not be allowed to drive home.    [facility] [place]- Preparing For Surgery  Before surgery, you can play an important role. Because skin is not sterile, your skin needs to be as free of germs as possible. You can reduce the number of germs on your skin by washing with CHG (chlorahexidine gluconate) Soap before surgery.  CHG is an antiseptic cleaner which kills germs and bonds with the skin to continue killing germs even after washing.    Oral Hygiene is also important to reduce your risk of infection.  Remember - BRUSH YOUR TEETH THE MORNING OF SURGERY WITH YOUR REGULAR TOOTHPASTE  Please do not use if you have an allergy to CHG or antibacterial soaps. If your skin  becomes reddened/irritated stop using the CHG.  Do not shave (including legs and underarms) for at least 48 hours prior to first CHG shower. It is OK to shave your face.  Please follow these instructions carefully.   1. Shower the NIGHT BEFORE SURGERY and the MORNING OF SURGERY with CHG.   2. If you chose to wash your hair, wash your hair first as usual with your normal shampoo.  3. After you shampoo, rinse your hair and body thoroughly to remove the shampoo.  4. Use CHG as you would any other liquid soap. You can apply CHG directly to the skin and wash gently with a scrungie or a clean washcloth.   5. Apply the CHG Soap to your body ONLY FROM THE NECK DOWN.  Do not use on open wounds or open sores. Avoid contact with your eyes, ears, mouth and genitals (private parts). Wash Face and genitals (private parts)  with your normal soap.  6. Wash thoroughly, paying special attention to the area where your surgery will be performed.  7. Thoroughly rinse your body with warm water from the neck down.  8. DO NOT shower/wash with your normal soap after using and rinsing off the CHG Soap.  9. Pat yourself dry with a CLEAN TOWEL.  10. Wear CLEAN PAJAMAS to bed the night before surgery, wear comfortable clothes the morning of surgery  11. Place CLEAN SHEETS on your  bed the night of your first shower and DO NOT SLEEP WITH PETS.   Day of Surgery:  Do not apply any deodorants/lotions.  Please wear clean clothes to the hospital/surgery center.   Remember to brush your teeth WITH YOUR REGULAR TOOTHPASTE.   Please read over the following fact sheets that you were given.

## 2017-11-25 NOTE — Progress Notes (Signed)
Anesthesia Chart Review: SAME DAY WORK-UP  Case:  [id number] Date/Time:  12/02/17 1100   Procedures:      STEREOTACTIC RIGHT PTERIONAL CRANIOTOMY FOR RESECTION OF MENINGIOMA (Right ) - STEREOTACTIC RIGHT PTERIONAL CRANIOTOMY FOR RESECTION OF MENINGIOMA     APPLICATION OF CRANIAL NAVIGATION (N/A ) - APPLICATION OF CRANIAL NAVIGATION   Anesthesia type:  General   Pre-op diagnosis:  MENINGIOMA   Location:  [facility] ROOM 21 / [facility]   Surgeon:  Consuella Lose, MD      DISCUSSION: Patient is a 67 year old female scheduled for the above procedure. History includes former smoker, HTN, hypercholesterolemia, meningioma. BMI is consistent with morbid obesity.   If no acute changes then I anticipate that she can proceed as planned.   VS: BP (!) 147/66   Pulse 86   Temp 36.7 C   Resp 20   Ht 5\' 2"  (1.575 m)   Wt 109.9 kg   SpO2 98%   BMI 44.32 kg/m    PROVIDERS: Ladell Pier, MD is PCP   LABS: Labs reviewed: Acceptable for surgery. (all labs ordered are listed, but only abnormal results are displayed)  Labs Reviewed  BASIC METABOLIC PANEL - Abnormal; Notable for the following components:      Result Value   Glucose, Bld 127 (*)    Calcium 8.6 (*)    GFR calc non Af Amer 58 (*)    All other components within normal limits  CBC  TYPE AND SCREEN  ABO/RH    IMAGES: MRI Brain 10/27/17: IMPRESSION: 3.4 cm in diameter round meningioma of the anterior middle cranial fossa on the right with small dural tails. Mass-effect upon the right temporal lobe but without edema. See above for other morphologic details.   EKG: 11/24/17: SR with first degree AV block. Non-specific ST abnormality.  First degree AVB is new when compared to 05/25/13 tracing.    CV: N/A  Past Medical History:  Diagnosis Date  . Anxiety   . Cataract    surgery - bilateral  . Colon polyps   . Depression   . History of hypokalemia   . Hypercholesterolemia    diet controlled - no meds  . Hypertension    . Obesity   . Pneumonia     Past Surgical History:  Procedure Laterality Date  . cataract surgery Bilateral   . CESAREAN SECTION     x 1  . COLONOSCOPY  08/2005   Olevia Perches- hx polyps  . EYE SURGERY Bilateral    Cataract surgery     MEDICATIONS: . losartan (COZAAR) 100 MG tablet  . Multiple Vitamin (MULTIVITAMIN WITH MINERALS) TABS tablet  . Probiotic Product (PROBIOTIC PO)   . 0.9 %  sodium chloride infusion    [facility]/Anesthesiology Phone [phone] 11/25/2017 2:52 PM
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
## 2017-12-02 ENCOUNTER — Inpatient Hospital Stay (HOSPITAL_COMMUNITY): Payer: Medicare Other | Admitting: Vascular Surgery

## 2017-12-02 ENCOUNTER — Inpatient Hospital Stay (HOSPITAL_COMMUNITY)
Admission: RE | Disposition: A | Discharge status: Home / Self Care | Payer: Self-pay | Source: Ambulatory Visit | Attending: Neurosurgery

## 2017-12-02 ENCOUNTER — Inpatient Hospital Stay (HOSPITAL_COMMUNITY): Payer: Medicare Other | Admitting: Anesthesiology

## 2017-12-02 ENCOUNTER — Encounter (HOSPITAL_COMMUNITY): Payer: Self-pay | Admitting: *Deleted

## 2017-12-02 ENCOUNTER — Inpatient Hospital Stay (HOSPITAL_COMMUNITY)
Admission: RE | Admit: 2017-12-02 | Discharge: 2017-12-05 | DRG: 026 | Disposition: A | Discharge status: Home / Self Care | Payer: Medicare Other | Source: Ambulatory Visit | Attending: Neurosurgery | Admitting: Neurosurgery

## 2017-12-02 ENCOUNTER — Other Ambulatory Visit: Payer: Self-pay

## 2017-12-02 DIAGNOSIS — Z86010 Personal history of colonic polyps: Secondary | ICD-10-CM | POA: Diagnosis not present

## 2017-12-02 DIAGNOSIS — D320 Benign neoplasm of cerebral meninges: Secondary | ICD-10-CM | POA: Diagnosis not present

## 2017-12-02 DIAGNOSIS — R0902 Hypoxemia: Secondary | ICD-10-CM | POA: Diagnosis not present

## 2017-12-02 DIAGNOSIS — D329 Benign neoplasm of meninges, unspecified: Secondary | ICD-10-CM | POA: Diagnosis present

## 2017-12-02 DIAGNOSIS — Z87891 Personal history of nicotine dependence: Secondary | ICD-10-CM

## 2017-12-02 DIAGNOSIS — Z6841 Body Mass Index (BMI) 40.0 and over, adult: Secondary | ICD-10-CM

## 2017-12-02 DIAGNOSIS — K219 Gastro-esophageal reflux disease without esophagitis: Secondary | ICD-10-CM | POA: Diagnosis not present

## 2017-12-02 DIAGNOSIS — I10 Essential (primary) hypertension: Secondary | ICD-10-CM | POA: Diagnosis not present

## 2017-12-02 DIAGNOSIS — Z9889 Other specified postprocedural states: Secondary | ICD-10-CM

## 2017-12-02 DIAGNOSIS — R402140 Coma scale, eyes open, spontaneous, unspecified time: Secondary | ICD-10-CM | POA: Diagnosis present

## 2017-12-02 DIAGNOSIS — D420 Neoplasm of uncertain behavior of cerebral meninges: Secondary | ICD-10-CM | POA: Diagnosis not present

## 2017-12-02 DIAGNOSIS — Z888 Allergy status to other drugs, medicaments and biological substances status: Secondary | ICD-10-CM | POA: Diagnosis not present

## 2017-12-02 DIAGNOSIS — R402250 Coma scale, best verbal response, oriented, unspecified time: Secondary | ICD-10-CM | POA: Diagnosis not present

## 2017-12-02 DIAGNOSIS — R402360 Coma scale, best motor response, obeys commands, unspecified time: Secondary | ICD-10-CM | POA: Diagnosis present

## 2017-12-02 HISTORY — PX: CRANIOTOMY: SHX93

## 2017-12-02 HISTORY — PX: APPLICATION OF CRANIAL NAVIGATION: SHX6578

## 2017-12-02 LAB — MRSA PCR SCREENING: MRSA BY PCR: NEGATIVE

## 2017-12-02 SURGERY — CRANIOTOMY TUMOR EXCISION
Anesthesia: General | Site: Head | Laterality: Right

## 2017-12-02 MED ORDER — NALOXONE HCL 0.4 MG/ML IJ SOLN: 0.0800 mg | INTRAMUSCULAR | Status: DC | PRN

## 2017-12-02 MED ORDER — CEFAZOLIN SODIUM-DEXTROSE 2-4 GM/100ML-% IV SOLN
INTRAVENOUS | Status: AC
  Filled 2017-12-02: qty 100

## 2017-12-02 MED ORDER — THROMBIN 20000 UNITS EX SOLR
CUTANEOUS | Status: DC | PRN
  Administered 2017-12-02: 20 mL via TOPICAL

## 2017-12-02 MED ORDER — PHENYLEPHRINE HCL 10 MG/ML IJ SOLN
INTRAMUSCULAR | Status: DC | PRN
  Administered 2017-12-02 (×3): 80 ug via INTRAVENOUS

## 2017-12-02 MED ORDER — BUPIVACAINE HCL (PF) 0.5 % IJ SOLN
INTRAMUSCULAR | Status: AC
  Filled 2017-12-02: qty 30

## 2017-12-02 MED ORDER — DEXAMETHASONE SODIUM PHOSPHATE 10 MG/ML IJ SOLN
INTRAMUSCULAR | Status: AC
  Filled 2017-12-02: qty 1

## 2017-12-02 MED ORDER — DOCUSATE SODIUM 100 MG PO CAPS
100.0000 mg | ORAL_CAPSULE | Freq: Two times a day (BID) | ORAL | Status: DC
  Administered 2017-12-02 – 2017-12-05 (×6): 100 mg via ORAL
  Filled 2017-12-02 (×6): qty 1

## 2017-12-02 MED ORDER — LIDOCAINE 2% (20 MG/ML) 5 ML SYRINGE
INTRAMUSCULAR | Status: AC
  Filled 2017-12-02: qty 5

## 2017-12-02 MED ORDER — CHLORHEXIDINE GLUCONATE CLOTH 2 % EX PADS: 6.0000 | MEDICATED_PAD | Freq: Once | CUTANEOUS | Status: DC

## 2017-12-02 MED ORDER — PHENYLEPHRINE 40 MCG/ML (10ML) SYRINGE FOR IV PUSH (FOR BLOOD PRESSURE SUPPORT)
PREFILLED_SYRINGE | INTRAVENOUS | Status: AC
  Filled 2017-12-02: qty 20

## 2017-12-02 MED ORDER — THROMBIN (RECOMBINANT) 20000 UNITS EX SOLR
CUTANEOUS | Status: AC
  Filled 2017-12-02: qty 20000

## 2017-12-02 MED ORDER — ROCURONIUM BROMIDE 50 MG/5ML IV SOSY
PREFILLED_SYRINGE | INTRAVENOUS | Status: AC
  Filled 2017-12-02: qty 20

## 2017-12-02 MED ORDER — LIDOCAINE-EPINEPHRINE 1 %-1:100000 IJ SOLN
INTRAMUSCULAR | Status: DC | PRN
  Administered 2017-12-02: 2.5 mL

## 2017-12-02 MED ORDER — FENTANYL CITRATE (PF) 100 MCG/2ML IJ SOLN: 25.0000 ug | INTRAMUSCULAR | Status: DC | PRN

## 2017-12-02 MED ORDER — EPHEDRINE SULFATE 50 MG/ML IJ SOLN
INTRAMUSCULAR | Status: DC | PRN
  Administered 2017-12-02: 5 mg via INTRAVENOUS

## 2017-12-02 MED ORDER — CEFAZOLIN SODIUM-DEXTROSE 2-4 GM/100ML-% IV SOLN
2.0000 g | Freq: Three times a day (TID) | INTRAVENOUS | Status: AC
  Administered 2017-12-02 – 2017-12-03 (×2): 2 g via INTRAVENOUS
  Filled 2017-12-02 (×2): qty 100

## 2017-12-02 MED ORDER — PROPOFOL 10 MG/ML IV BOLUS
INTRAVENOUS | Status: DC | PRN
  Administered 2017-12-02: 200 mg via INTRAVENOUS

## 2017-12-02 MED ORDER — ONDANSETRON HCL 4 MG/2ML IJ SOLN
INTRAMUSCULAR | Status: DC | PRN
  Administered 2017-12-02: 4 mg via INTRAVENOUS

## 2017-12-02 MED ORDER — SODIUM CHLORIDE 0.9 % IV SOLN
INTRAVENOUS | Status: DC
  Administered 2017-12-02 (×2): via INTRAVENOUS

## 2017-12-02 MED ORDER — PROMETHAZINE HCL 12.5 MG PO TABS
12.5000 mg | ORAL_TABLET | ORAL | Status: DC | PRN
  Filled 2017-12-02: qty 2

## 2017-12-02 MED ORDER — ACETAMINOPHEN 325 MG PO TABS
650.0000 mg | ORAL_TABLET | ORAL | Status: DC | PRN
  Administered 2017-12-02 – 2017-12-03 (×2): 650 mg via ORAL
  Filled 2017-12-02 (×2): qty 2

## 2017-12-02 MED ORDER — BACITRACIN ZINC 500 UNIT/GM EX OINT
TOPICAL_OINTMENT | CUTANEOUS | Status: AC
  Filled 2017-12-02: qty 28.35

## 2017-12-02 MED ORDER — DEXAMETHASONE SODIUM PHOSPHATE 4 MG/ML IJ SOLN
INTRAMUSCULAR | Status: DC | PRN
  Administered 2017-12-02: 10 mg via INTRAVENOUS

## 2017-12-02 MED ORDER — ACETAMINOPHEN 650 MG RE SUPP: 650.0000 mg | RECTAL | Status: DC | PRN

## 2017-12-02 MED ORDER — PROPOFOL 10 MG/ML IV BOLUS
INTRAVENOUS | Status: AC
  Filled 2017-12-02: qty 20

## 2017-12-02 MED ORDER — BUPIVACAINE HCL (PF) 0.5 % IJ SOLN
INTRAMUSCULAR | Status: DC | PRN
  Administered 2017-12-02: 2.5 mL

## 2017-12-02 MED ORDER — HEMOSTATIC AGENTS (NO CHARGE) OPTIME
TOPICAL | Status: DC | PRN
  Administered 2017-12-02: 1

## 2017-12-02 MED ORDER — FENTANYL CITRATE (PF) 250 MCG/5ML IJ SOLN
INTRAMUSCULAR | Status: AC
  Filled 2017-12-02: qty 5

## 2017-12-02 MED ORDER — SODIUM CHLORIDE 0.9 % IV SOLN
INTRAVENOUS | Status: DC
  Administered 2017-12-02: 10:00:00 via INTRAVENOUS

## 2017-12-02 MED ORDER — OXYCODONE HCL 5 MG/5ML PO SOLN: 5.0000 mg | Freq: Once | ORAL | Status: DC | PRN

## 2017-12-02 MED ORDER — ONDANSETRON HCL 4 MG/2ML IJ SOLN: 4.0000 mg | Freq: Four times a day (QID) | INTRAMUSCULAR | Status: DC | PRN

## 2017-12-02 MED ORDER — LIDOCAINE-EPINEPHRINE 1 %-1:100000 IJ SOLN
INTRAMUSCULAR | Status: AC
  Filled 2017-12-02: qty 1

## 2017-12-02 MED ORDER — SODIUM CHLORIDE 0.9 % IV SOLN
INTRAVENOUS | Status: DC
  Administered 2017-12-02: 17:00:00 via INTRAVENOUS

## 2017-12-02 MED ORDER — ROCURONIUM BROMIDE 100 MG/10ML IV SOLN
INTRAVENOUS | Status: DC | PRN
  Administered 2017-12-02 (×3): 50 mg via INTRAVENOUS

## 2017-12-02 MED ORDER — SUGAMMADEX SODIUM 200 MG/2ML IV SOLN
INTRAVENOUS | Status: DC | PRN
  Administered 2017-12-02: 500 mg via INTRAVENOUS

## 2017-12-02 MED ORDER — MIDAZOLAM HCL 2 MG/2ML IJ SOLN
INTRAMUSCULAR | Status: AC
  Filled 2017-12-02: qty 2

## 2017-12-02 MED ORDER — FENTANYL CITRATE (PF) 100 MCG/2ML IJ SOLN
INTRAMUSCULAR | Status: DC | PRN
  Administered 2017-12-02: 100 ug via INTRAVENOUS
  Administered 2017-12-02: 150 ug via INTRAVENOUS
  Administered 2017-12-02 (×2): 100 ug via INTRAVENOUS
  Administered 2017-12-02: 50 ug via INTRAVENOUS

## 2017-12-02 MED ORDER — LABETALOL HCL 5 MG/ML IV SOLN: 10.0000 mg | INTRAVENOUS | Status: DC | PRN

## 2017-12-02 MED ORDER — BISACODYL 5 MG PO TBEC: 5.0000 mg | DELAYED_RELEASE_TABLET | Freq: Every day | ORAL | Status: DC | PRN

## 2017-12-02 MED ORDER — CEFAZOLIN SODIUM-DEXTROSE 2-4 GM/100ML-% IV SOLN
2.0000 g | INTRAVENOUS | Status: AC
  Administered 2017-12-02: 2 g via INTRAVENOUS

## 2017-12-02 MED ORDER — ONDANSETRON HCL 4 MG/2ML IJ SOLN
4.0000 mg | INTRAMUSCULAR | Status: DC | PRN
  Administered 2017-12-04: 4 mg via INTRAVENOUS
  Filled 2017-12-02: qty 2

## 2017-12-02 MED ORDER — HYDROMORPHONE HCL 1 MG/ML IJ SOLN: 0.5000 mg | INTRAMUSCULAR | Status: DC | PRN

## 2017-12-02 MED ORDER — BACITRACIN ZINC 500 UNIT/GM EX OINT
TOPICAL_OINTMENT | CUTANEOUS | Status: DC | PRN
  Administered 2017-12-02: 1 via TOPICAL

## 2017-12-02 MED ORDER — OXYCODONE HCL 5 MG PO TABS: 5.0000 mg | ORAL_TABLET | Freq: Once | ORAL | Status: DC | PRN

## 2017-12-02 MED ORDER — LEVETIRACETAM IN NACL 500 MG/100ML IV SOLN
500.0000 mg | Freq: Two times a day (BID) | INTRAVENOUS | Status: DC
  Administered 2017-12-02 – 2017-12-05 (×7): 500 mg via INTRAVENOUS
  Filled 2017-12-02 (×8): qty 100

## 2017-12-02 MED ORDER — ONDANSETRON HCL 4 MG PO TABS: 4.0000 mg | ORAL_TABLET | ORAL | Status: DC | PRN

## 2017-12-02 MED ORDER — HYDRALAZINE HCL 20 MG/ML IJ SOLN: 10.0000 mg | INTRAMUSCULAR | Status: DC | PRN

## 2017-12-02 MED ORDER — MAGNESIUM CITRATE PO SOLN: 1.0000 | Freq: Once | ORAL | Status: DC | PRN

## 2017-12-02 MED ORDER — 0.9 % SODIUM CHLORIDE (POUR BTL) OPTIME
TOPICAL | Status: DC | PRN
  Administered 2017-12-02 (×3): 1000 mL

## 2017-12-02 MED ORDER — LIDOCAINE HCL (CARDIAC) PF 100 MG/5ML IV SOSY
PREFILLED_SYRINGE | INTRAVENOUS | Status: DC | PRN
  Administered 2017-12-02: 60 mg via INTRAVENOUS

## 2017-12-02 MED ORDER — ONDANSETRON HCL 4 MG/2ML IJ SOLN
INTRAMUSCULAR | Status: AC
  Filled 2017-12-02: qty 2

## 2017-12-02 MED ORDER — HYDROCODONE-ACETAMINOPHEN 5-325 MG PO TABS
1.0000 | ORAL_TABLET | ORAL | Status: DC | PRN
  Administered 2017-12-03 – 2017-12-05 (×8): 1 via ORAL
  Filled 2017-12-02 (×8): qty 1

## 2017-12-02 MED ORDER — MIDAZOLAM HCL 5 MG/5ML IJ SOLN
INTRAMUSCULAR | Status: DC | PRN
  Administered 2017-12-02: 2 mg via INTRAVENOUS

## 2017-12-02 MED ORDER — SENNOSIDES-DOCUSATE SODIUM 8.6-50 MG PO TABS: 1.0000 | ORAL_TABLET | Freq: Every evening | ORAL | Status: DC | PRN

## 2017-12-02 SURGICAL SUPPLY — 113 items
AGENT HMST MTR 8 SURGIFLO (HEMOSTASIS) ×2
APL SKNCLS STERI-STRIP NONHPOA (GAUZE/BANDAGES/DRESSINGS)
BATTERY IQ STERILE (MISCELLANEOUS) ×1 IMPLANT
BENZOIN TINCTURE PRP APPL 2/3 (GAUZE/BANDAGES/DRESSINGS) IMPLANT
BLADE CLIPPER SURG (BLADE) ×3 IMPLANT
BLADE SAW GIGLI 16 STRL (MISCELLANEOUS) IMPLANT
BLADE SURG 15 STRL LF DISP TIS (BLADE) IMPLANT
BLADE SURG 15 STRL SS (BLADE)
BLADE ULTRA TIP 2M (BLADE) ×2 IMPLANT
BNDG CMPR 75X41 PLY ABS (GAUZE/BANDAGES/DRESSINGS) ×2
BNDG CMPR 75X41 PLY HI ABS (GAUZE/BANDAGES/DRESSINGS)
BNDG GAUZE ELAST 4 BULKY (GAUZE/BANDAGES/DRESSINGS) ×4 IMPLANT
BNDG STRETCH 4X75 NS LF (GAUZE/BANDAGES/DRESSINGS) ×1 IMPLANT
BNDG STRETCH 4X75 STRL LF (GAUZE/BANDAGES/DRESSINGS) IMPLANT
BTRY SRG DRVR 1.5 IQ (MISCELLANEOUS) ×2
BUR ACORN 6.0 PRECISION (BURR) ×3 IMPLANT
BUR ROUND FLUTED 4 SOFT TCH (BURR) ×1 IMPLANT
BUR SPIRAL ROUTER 2.3 (BUR) ×3 IMPLANT
CANISTER SUCT 3000ML PPV (MISCELLANEOUS) ×6 IMPLANT
CARTRIDGE OIL MAESTRO DRILL (MISCELLANEOUS) ×4 IMPLANT
CATH VENTRIC 35X38 W/TROCAR LG (CATHETERS) IMPLANT
CLIP VESOCCLUDE MED 6/CT (CLIP) IMPLANT
CONT SPEC 4OZ CLIKSEAL STRL BL (MISCELLANEOUS) ×4 IMPLANT
COVER MAYO STAND STRL (DRAPES) ×2 IMPLANT
DECANTER SPIKE VIAL GLASS SM (MISCELLANEOUS) ×3 IMPLANT
DIFFUSER DRILL AIR PNEUMATIC (MISCELLANEOUS) ×5 IMPLANT
DRAIN SUBARACHNOID (WOUND CARE) IMPLANT
DRAPE HALF SHEET 40X57 (DRAPES) ×3 IMPLANT
DRAPE MICROSCOPE LEICA (MISCELLANEOUS) ×1 IMPLANT
DRAPE NEUROLOGICAL W/INCISE (DRAPES) ×3 IMPLANT
DRAPE STERI IOBAN 125X83 (DRAPES) IMPLANT
DRAPE SURG 17X23 STRL (DRAPES) IMPLANT
DRAPE WARM FLUID 44X44 (DRAPE) ×3 IMPLANT
DRSG ADAPTIC 3X8 NADH LF (GAUZE/BANDAGES/DRESSINGS) IMPLANT
DRSG TELFA 3X8 NADH (GAUZE/BANDAGES/DRESSINGS) ×6 IMPLANT
DURAMATRIX ONLAY 3X3 (Plate) ×1 IMPLANT
DURAPREP 6ML APPLICATOR 50/CS (WOUND CARE) ×3 IMPLANT
ELECT REM PT RETURN 9FT ADLT (ELECTROSURGICAL) ×3
ELECTRODE REM PT RTRN 9FT ADLT (ELECTROSURGICAL) ×2 IMPLANT
EVACUATOR 1/8 PVC DRAIN (DRAIN) IMPLANT
EVACUATOR SILICONE 100CC (DRAIN) IMPLANT
FORCEPS BIPOLAR SPETZLER 8 1.0 (NEUROSURGERY SUPPLIES) ×4 IMPLANT
GAUZE 4X4 16PLY RFD (DISPOSABLE) IMPLANT
GAUZE SPONGE 4X4 12PLY STRL (GAUZE/BANDAGES/DRESSINGS) ×2 IMPLANT
GLOVE BIO SURGEON STRL SZ7 (GLOVE) IMPLANT
GLOVE BIOGEL PI IND STRL 7.0 (GLOVE) IMPLANT
GLOVE BIOGEL PI IND STRL 7.5 (GLOVE) ×2 IMPLANT
GLOVE BIOGEL PI INDICATOR 7.0 (GLOVE) ×2
GLOVE BIOGEL PI INDICATOR 7.5 (GLOVE) ×2
GLOVE ECLIPSE 7.0 STRL STRAW (GLOVE) ×6 IMPLANT
GLOVE EXAM NITRILE LRG STRL (GLOVE) IMPLANT
GLOVE EXAM NITRILE XL STR (GLOVE) IMPLANT
GLOVE EXAM NITRILE XS STR PU (GLOVE) IMPLANT
GLOVE SURG SS PI 7.0 STRL IVOR (GLOVE) ×4 IMPLANT
GLOVE SURG SS PI 7.5 STRL IVOR (GLOVE) ×3 IMPLANT
GOWN STRL REUS W/ TWL LRG LVL3 (GOWN DISPOSABLE) ×4 IMPLANT
GOWN STRL REUS W/ TWL XL LVL3 (GOWN DISPOSABLE) IMPLANT
GOWN STRL REUS W/TWL 2XL LVL3 (GOWN DISPOSABLE) IMPLANT
GOWN STRL REUS W/TWL LRG LVL3 (GOWN DISPOSABLE) ×12
GOWN STRL REUS W/TWL XL LVL3 (GOWN DISPOSABLE)
HEMOSTAT POWDER KIT SURGIFOAM (HEMOSTASIS) ×2 IMPLANT
HEMOSTAT SURGICEL 2X14 (HEMOSTASIS) ×3 IMPLANT
HOOK DURA 1/2IN (MISCELLANEOUS) ×3 IMPLANT
IV NS 1000ML (IV SOLUTION)
IV NS 1000ML BAXH (IV SOLUTION) ×2 IMPLANT
KIT BASIN OR (CUSTOM PROCEDURE TRAY) ×3 IMPLANT
KIT DRAIN CSF ACCUDRAIN (MISCELLANEOUS) IMPLANT
KIT TURNOVER KIT B (KITS) ×3 IMPLANT
KNIFE ARACHNOID DISP AM-24-S (MISCELLANEOUS) ×3 IMPLANT
MARKER SPHERE PSV REFLC 13MM (MARKER) ×6 IMPLANT
NDL SPNL 18GX3.5 QUINCKE PK (NEEDLE) IMPLANT
NEEDLE HYPO 22GX1.5 SAFETY (NEEDLE) ×3 IMPLANT
NEEDLE SPNL 18GX3.5 QUINCKE PK (NEEDLE) IMPLANT
NS IRRIG 1000ML POUR BTL (IV SOLUTION) ×9 IMPLANT
OIL CARTRIDGE MAESTRO DRILL (MISCELLANEOUS) ×3
PACK CRANIOTOMY CUSTOM (CUSTOM PROCEDURE TRAY) ×3 IMPLANT
PAD DRESSING TELFA 3X8 NADH (GAUZE/BANDAGES/DRESSINGS) IMPLANT
PATTIES SURGICAL .25X.25 (GAUZE/BANDAGES/DRESSINGS) IMPLANT
PATTIES SURGICAL .5 X.5 (GAUZE/BANDAGES/DRESSINGS) ×1 IMPLANT
PATTIES SURGICAL .5 X3 (DISPOSABLE) IMPLANT
PATTIES SURGICAL 1/4 X 3 (GAUZE/BANDAGES/DRESSINGS) IMPLANT
PATTIES SURGICAL 1X1 (DISPOSABLE) IMPLANT
PIN MAYFIELD SKULL DISP (PIN) ×3 IMPLANT
PLATE 1.5  2HOLE LNG NEURO (Plate) ×2 IMPLANT
PLATE 1.5 2HOLE LNG NEURO (Plate) IMPLANT
PLATE 1.5 4HOLE LONG STRAIGHT (Plate) ×1 IMPLANT
RUBBERBAND STERILE (MISCELLANEOUS) ×2 IMPLANT
SCREW SELF DRILL HT 1.5/4MM (Screw) ×6 IMPLANT
SET TUBING W/EXT DISP (INSTRUMENTS) ×2 IMPLANT
SPECIMEN JAR SMALL (MISCELLANEOUS) ×1 IMPLANT
SPOGE SURGIFLO 8M (HEMOSTASIS) ×1
SPONGE NEURO XRAY DETECT 1X3 (DISPOSABLE) IMPLANT
SPONGE SURGIFLO 8M (HEMOSTASIS) IMPLANT
SPONGE SURGIFOAM ABS GEL 100 (HEMOSTASIS) ×3 IMPLANT
STAPLER VISISTAT 35W (STAPLE) ×4 IMPLANT
STOCKINETTE 6  STRL (DRAPES) ×1
STOCKINETTE 6 STRL (DRAPES) IMPLANT
SUT ETHILON 3 0 FSL (SUTURE) IMPLANT
SUT ETHILON 3 0 PS 1 (SUTURE) IMPLANT
SUT NURALON 4 0 TR CR/8 (SUTURE) ×8 IMPLANT
SUT SILK 0 TIES 10X30 (SUTURE) IMPLANT
SUT VIC AB 0 CT1 18XCR BRD8 (SUTURE) ×4 IMPLANT
SUT VIC AB 0 CT1 8-18 (SUTURE) ×6
SUT VIC AB 2-0 CT2 18 VCP726D (SUTURE) ×2 IMPLANT
SUT VIC AB 3-0 SH 8-18 (SUTURE) ×6 IMPLANT
TAPE CLOTH 1X10 TAN NS (GAUZE/BANDAGES/DRESSINGS) ×2 IMPLANT
TIP STRAIGHT 25KHZ (INSTRUMENTS) ×4 IMPLANT
TOWEL GREEN STERILE (TOWEL DISPOSABLE) ×3 IMPLANT
TOWEL GREEN STERILE FF (TOWEL DISPOSABLE) ×3 IMPLANT
TRAY FOLEY MTR SLVR 16FR STAT (SET/KITS/TRAYS/PACK) ×3 IMPLANT
TUBE CONNECTING 12X1/4 (SUCTIONS) ×3 IMPLANT
UNDERPAD 30X30 (UNDERPADS AND DIAPERS) ×2 IMPLANT
WATER STERILE IRR 1000ML POUR (IV SOLUTION) ×3 IMPLANT

## 2017-12-02 NOTE — Anesthesia Preprocedure Evaluation (Signed)
Anesthesia Evaluation  Patient identified by MRN, date of birth, ID band Patient awake    Reviewed: Allergy & Precautions, H&P , NPO status , Patient's Chart, lab work & pertinent test results  Airway Mallampati: II   Neck ROM: full    Dental   Pulmonary former smoker,    breath sounds clear to auscultation       Cardiovascular hypertension,  Rhythm:regular Rate:Normal     Neuro/Psych PSYCHIATRIC DISORDERS Anxiety Depression    GI/Hepatic GERD  ,  Endo/Other  Morbid obesity  Renal/GU      Musculoskeletal   Abdominal   Peds  Hematology   Anesthesia Other Findings   Reproductive/Obstetrics                             Anesthesia Physical Anesthesia Plan  ASA: II  Anesthesia Plan: General   Post-op Pain Management:    Induction: Intravenous  PONV Risk Score and Plan: 3 and Ondansetron, Dexamethasone, Midazolam and Treatment may vary due to age or medical condition  Airway Management Planned: Oral ETT  Additional Equipment: Arterial line  Intra-op Plan:   Post-operative Plan: Extubation in OR  Informed Consent: I have reviewed the patients History and Physical, chart, labs and discussed the procedure including the risks, benefits and alternatives for the proposed anesthesia with the patient or authorized representative who has indicated his/her understanding and acceptance.     Plan Discussed with: CRNA, Anesthesiologist and Surgeon  Anesthesia Plan Comments:         Anesthesia Quick Evaluation

## 2017-12-02 NOTE — Progress Notes (Addendum)
Chaplin Paged  10:54 AM Chaplin on the way

## 2017-12-02 NOTE — Anesthesia Procedure Notes (Signed)
Arterial Line Insertion Start/End8/23/2019 10:30 AM, 12/02/2017 10:45 AM Performed by: Leonor Liv, CRNA, CRNA  Patient location: Pre-op. Preanesthetic checklist: patient identified, IV checked, site marked, risks and benefits discussed, surgical consent, monitors and equipment checked, pre-op evaluation, timeout performed and anesthesia consent Lidocaine 1% used for infiltration Left, radial was placed Catheter size: 20 G Hand hygiene performed  and maximum sterile barriers used  Allen's test indicative of satisfactory collateral circulation Attempts: 1 Procedure performed without using ultrasound guided technique. Following insertion, dressing applied and Biopatch. Post procedure assessment: normal  Patient tolerated the procedure well with no immediate complications.

## 2017-12-02 NOTE — Progress Notes (Signed)
   12/02/17 1100  Clinical Encounter Type  Visited With Patient;Health care provider  Visit Type Initial;Pre-op  Referral From Patient;Nurse  Consult/Referral To Chaplain  Spiritual Encounters  Spiritual Needs Prayer   Responded to a SCC and Page for prayer with this patient prior to surgery.  Upon arrival patient was getting ready to go, so we prayed.  Will follow and support as needed. Chaplain Katherene Ponto

## 2017-12-02 NOTE — Op Note (Signed)
NEUROSURGERY OPERATIVE NOTE   PREOP DIAGNOSIS:  1. Right temporal tumor   POSTOP DIAGNOSIS: Same  PROCEDURE: 1. Stereotactic right pterional craniotomy, resection of extra-axial tumor 2. Use of microscope for microdissection  SURGEON: Dr. Consuella Lose, MD  ASSISTANT: Ferne Reus, PA-C  ANESTHESIA: General Endotracheal  EBL: 200cc  SPECIMENS: Right temporal tumor  DRAINS: None  COMPLICATIONS: None immediate  CONDITION: Hemodynamically stable to PACU  HISTORY: Angelica Harvey is a 68 y.o. female initially presented to the outpatient neurosurgery clinic after CT scan demonstrated a large right extra-axial temporal tumor.  She was largely asymptomatic, however review of previous studies demonstrated a previous CT scan from a few years ago which demonstrated presence of the tumor much smaller at that time.  Given the significant growth over the course of the few years, along with the fact that it was now adjacent to the middle cerebral artery as well as the orbital apex, surgical resection was indicated.  The risks and benefits of the surgery were explained in detail to the patient and her family.  After all questions were answered informed consent was obtained and witnessed.  PROCEDURE IN DETAIL: The patient was brought to the operating room. After induction of general anesthesia, the patient was positioned on the operative table in the [place] head holder in the supine position. All pressure points were meticulously padded.  Head was turned to the left, in slight extension.  Using the stereotactic preoperative CT scan, surface markers were correct to start until a satisfactory accuracy was achieved.  A curvilinear skin incision was then marked out to allow access to the temporal pole and the posterior extent of the tumor.  The surface projection of the sylvian fissure was also marked out.  Skin incision was then marked out and prepped and draped in the usual sterile  fashion.  After timeout was conducted, skin incision was infiltrated with local anesthetic with epinephrine.  Incision was then made sharply and carried through the galea.  Hemostasis on the skin edge was achieved with Raney clips.  The superficial and deep temporal fat pad was then incised to identify the deep temporal fascia.  The fat pad was elevated with the skin and reflected anteriorly.  The temporal fascia was then incised along the lateral orbit and zygoma.  Fishhooks were then used for retraction.  The temporalis muscle and fascia were then incised just inferior to the superior temporal line, and inferiorly toward the zygoma.  The temporalis muscle was then elevated and reflected inferiorly.  Stereotactic system was used to confirm that we would have good access to the temporal pole.  Pleural holes were then created in the keyhole, at the posterior superior temporal line, and just above the root of the zygoma.  These were connected with the craniotome and a single piece frontotemporal flap was elevated.  Hemostasis on the epidural plane was achieved with bipolar electrocautery and morselized Gelfoam with thrombin.  The high-speed drill was then used to drill down the lesser wing of the sphenoid, and remove the squamous temporal bone until the temporal pole was identified.  At this point the dura was opened in a curvilinear fashion based anteriorly, and 4-0 Nurolon stitches were used to tack up the dura.  The tumor was identified abutting the sylvian fissure, at the temporal pole.  At this point the microscope was draped sterilely and brought into the field, and the remainder of the case was done under the microscope using microdissection technique.  Initially, the posterior  margin of the tumor was dissected away from the temporal lobe.  There was a partial arachnoid plane which was utilized for this dissection.  Once partial dissection posteriorly was completed, bipolar electrocautery was used to  begin to cauterize the dural attachment which was from the lesser wing of the sphenoid extending toward the temporal pole.  I then internally debulked a portion of the tumor, which allowed further posterior dissection.  At this point, I dissected along the superficial portion of the tumor anteriorly, to further reduce blood supply from the dural base.  Once was completed, further debulking of the tumor anteriorly was completed.  I then turned my attention to a subfrontal approach, and the olfactory nerve and optic nerve were identified.  Arachnoid overlying the optical carotid cistern was then opened to allow further brain relaxation.  This then allowed visualization of the proximal portion of the supraclinoid internal carotid artery.  There was a good medial arachnoid plane with the tumor laterally along that temporal side of the sylvian fissure.  This plane was utilized to further dissect the tumor from the sylvian vessels.  During this dissection, the superficial middle cerebral draining veins were avulsed from their insertion into the dura and the sphenoparietal sinus.  The free end was coagulated.  After some dissection away from the sylvian vessels, I again turned attention to the dural attachment with which was further coagulated, and the tumor was again internally debulk.  This again allowed coagulation of the dural base, until the tumor was completely freed from its dural attachment along the sphenoid.  Once it was completely freed, the tumor was further debulked, and finally removed.  During the last bit of dissection, a branch of the middle cerebral artery coursing along the posterior margin of the tumor and somewhat adherent was coagulated and divided.  The remainder of the tumor was then removed and sent for permanent pathology.  The resection cavity was then irrigated with a copious amount of normal saline irrigation.  There is no residual tumor identified.  The dural attachment had been  completely coagulated with the bipolar during dissection.  Hemostasis was secured with a combination of bipolar electrocautery and morselized Gelfoam with thrombin.  The dura was then reapproximated with interrupted 3-0 Vicryl stitches.  A collagen dural onlay graft was then placed.  The bone was then replaced and plated with standard titanium plates and screws.  Hemostasis on the temporalis muscle was achieved with bipolar electrocautery.  The temporalis muscle and fascia were then reapproximated with interrupted 0 Vicryl stitches.  The galea was reapproximated with interrupted 0 and 3-0 Vicryl stitches and the skin was closed with staples.  Sterile dressing was then applied.  At the end of the case all sponge, needle, and instrument counts were correct. The patient was then transferred to the stretcher, extubated, and taken to the post-anesthesia care unit in stable hemodynamic condition.

## 2017-12-02 NOTE — Plan of Care (Signed)

## 2017-12-02 NOTE — Anesthesia Procedure Notes (Signed)
Procedure Name: Intubation Date/Time: 12/02/2017 12:03 PM Performed by: Hendrik Donath T, CRNA Pre-anesthesia Checklist: Patient identified, Emergency Drugs available, Suction available and Patient being monitored Patient Re-evaluated:Patient Re-evaluated prior to induction Oxygen Delivery Method: Circle system utilized Preoxygenation: Pre-oxygenation with 100% oxygen Induction Type: IV induction Ventilation: Mask ventilation without difficulty Laryngoscope Size: Miller and 2 Grade View: Grade II Tube type: Oral Tube size: 7.5 mm Number of attempts: 1 Airway Equipment and Method: Patient positioned with wedge pillow and Stylet Placement Confirmation: ETT inserted through vocal cords under direct vision,  breath sounds checked- equal and bilateral and positive ETCO2 Secured at: 21 cm Tube secured with: Tape Dental Injury: Teeth and Oropharynx as per pre-operative assessment

## 2017-12-02 NOTE — Transfer of Care (Signed)
Immediate Anesthesia Transfer of Care Note  Patient: Angelica Harvey  Procedure(s) Performed: STEREOTACTIC RIGHT PTERIONAL CRANIOTOMY FOR RESECTION OF MENINGIOMA (Right Head) APPLICATION OF CRANIAL NAVIGATION (N/A Head)  Patient Location: PACU  Anesthesia Type:General  Level of Consciousness: awake, alert  and oriented  Airway & Oxygen Therapy: Patient Spontanous Breathing and Patient connected to nasal cannula oxygen  Post-op Assessment: Report given to RN, Post -op Vital signs reviewed and stable and Patient moving all extremities  Post vital signs: Reviewed and stable  Last Vitals:  Vitals Value Taken Time  BP 142/79 12/02/2017  4:24 PM  Temp    Pulse 78 12/02/2017  4:25 PM  Resp 17 12/02/2017  4:25 PM  SpO2 97 % 12/02/2017  4:25 PM  Vitals shown include unvalidated device data.  Last Pain:  Vitals:   12/02/17 1620  TempSrc:   PainSc: (P) Asleep      Patients Stated Pain Goal: 3 (09/32/67 1245)  Complications: No apparent anesthesia complications

## 2017-12-03 ENCOUNTER — Inpatient Hospital Stay (HOSPITAL_COMMUNITY): Payer: Medicare Other

## 2017-12-03 LAB — BASIC METABOLIC PANEL
Anion gap: 7 (ref 5–15)
BUN: 6 mg/dL — ABNORMAL LOW (ref 8–23)
CO2: 26 mmol/L (ref 22–32)
Calcium: 8.3 mg/dL — ABNORMAL LOW (ref 8.9–10.3)
Chloride: 108 mmol/L (ref 98–111)
Creatinine, Ser: 1.02 mg/dL — ABNORMAL HIGH (ref 0.44–1.00)
GFR calc Af Amer: >60 mL/min (ref >60)
GFR calc non Af Amer: 56 mL/min — ABNORMAL LOW (ref >60)
Glucose, Bld: 120 mg/dL — ABNORMAL HIGH (ref 70–99)
Potassium: 4.1 mmol/L (ref 3.5–5.1)
Sodium: 141 mmol/L (ref 135–145)

## 2017-12-03 LAB — CBC
HCT: 37.7 % (ref 36.0–46.0)
Hemoglobin: 11.7 g/dL — ABNORMAL LOW (ref 12.0–15.0)
MCH: 27.9 pg (ref 26.0–34.0)
MCHC: 31 g/dL (ref 30.0–36.0)
MCV: 90 fL (ref 78.0–100.0)
Platelets: 182 10*3/uL (ref 150–400)
RBC: 4.19 MIL/uL (ref 3.87–5.11)
RDW: 13.9 % (ref 11.5–15.5)
WBC: 11.7 10*3/uL — ABNORMAL HIGH (ref 4.0–10.5)

## 2017-12-03 MED ORDER — GADOBENATE DIMEGLUMINE 529 MG/ML IV SOLN
20.0000 mL | Freq: Once | INTRAVENOUS | Status: AC | PRN
  Administered 2017-12-03: 20 mL via INTRAVENOUS

## 2017-12-03 NOTE — Anesthesia Postprocedure Evaluation (Addendum)
Anesthesia Post Note  Patient: CHERINE DRUMGOOLE  Procedure(s) Performed: STEREOTACTIC RIGHT PTERIONAL CRANIOTOMY FOR RESECTION OF MENINGIOMA (Right Head) APPLICATION OF CRANIAL NAVIGATION (N/A Head)     Patient location during evaluation: PACU Anesthesia Type: General Level of consciousness: awake and alert Pain management: pain level controlled Vital Signs Assessment: post-procedure vital signs reviewed and stable Respiratory status: spontaneous breathing, nonlabored ventilation, respiratory function stable and patient connected to nasal cannula oxygen Cardiovascular status: blood pressure returned to baseline and stable Postop Assessment: no apparent nausea or vomiting Anesthetic complications: no    Last Vitals:  Vitals:   12/03/17 0800 12/03/17 0900  BP: 126/60 (!) 142/51  Pulse: 72 69  Resp: (!) 23 (!) 26  Temp: 36.8 C   SpO2: 95% 94%    Last Pain:  Vitals:   12/03/17 0800  TempSrc: Oral  PainSc: [place]

## 2017-12-03 NOTE — Progress Notes (Signed)
Patient ID: Angelica Harvey, female   DOB: 06-20-1950, 67 y.o.   MRN: [id number] Looks good on postop day 1. Awake and alert and following commands. Moving all extremities. Dressing dry. Will mobilize today.
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
## 2017-12-04 NOTE — Progress Notes (Signed)
Patient ID: Angelica Harvey, female   DOB: 1950-10-18, 67 y.o.   MRN: [id number] Doing very well. Headache. A postoperative MRI looks good. She is moving all extremities. She is out of bed to a chair. She is eating and drinking well. We'll transfer to floor today.

## 2017-12-05 ENCOUNTER — Encounter (HOSPITAL_COMMUNITY): Payer: Self-pay | Admitting: Neurosurgery

## 2017-12-05 ENCOUNTER — Inpatient Hospital Stay (HOSPITAL_COMMUNITY): Payer: Medicare Other

## 2017-12-05 MED ORDER — OXYCODONE-ACETAMINOPHEN 7.5-325 MG PO TABS: 1.0000 | ORAL_TABLET | ORAL | 0 refills | Status: DC | PRN

## 2017-12-05 MED ORDER — IOPAMIDOL (ISOVUE-370) INJECTION 76%
80.0000 mL | Freq: Once | INTRAVENOUS | Status: AC | PRN
  Administered 2017-12-05: 80 mL via INTRAVENOUS

## 2017-12-05 MED ORDER — LEVETIRACETAM 500 MG PO TABS: 500.0000 mg | ORAL_TABLET | Freq: Two times a day (BID) | ORAL | 2 refills | Status: DC

## 2017-12-05 MED FILL — Thrombin (Recombinant) For Soln 20000 Unit: CUTANEOUS | Qty: 1 | Status: AC

## 2017-12-05 NOTE — Progress Notes (Signed)
SATURATION QUALIFICATIONS: (This note is used to comply with regulatory documentation for home oxygen)  Patient Saturations on Room Air at Rest = 85%  Patient Saturations on Room Air while Ambulating = 83%  Patient Saturations on 2 Liters of oxygen while Ambulating = 92%  Please briefly explain why patient needs home oxygen: Pt needs 02 to maintain 02 sats >88% during mobility.   Wray Kearns, [place], DPT [phone]

## 2017-12-05 NOTE — Discharge Summary (Signed)
Physician Discharge Summary  Patient ID: Angelica Harvey MRN: [id number] DOB/AGE: 66-Apr-1952 66 y.o.  Admit date: 12/02/2017 Discharge date: 12/05/2017  Admission Diagnoses:  Meningioma  Discharge Diagnoses:  Same Active Problems:   Status post craniotomy   Meningioma [facility])   Discharged Condition: Stable  Hospital Course:  Angelica Harvey is a 67 y.o. female who was admitted for the below procedure. There were no post operative complications. At time of discharge, pain was well controlled, ambulating with Pt/OT, tolerating po, voiding normal. Ready for discharge.  Treatments: Surgery 1. Stereotactic right pterional craniotomy, resection of extra-axial tumor 2. Use of microscope for microdissection  Discharge Exam: Blood pressure (!) 146/70, pulse 61, temperature 98.4 F (36.9 C), temperature source Oral, resp. rate 18, height 5\' 2"  (1.575 m), weight 109.9 kg, SpO2 98 %. Awake, alert, oriented Speech fluent, appropriate CN grossly intact 5/5 BUE/BLE Wound c/d/i  Disposition: Discharge disposition: 01-Home or Self Care       Discharge Instructions    Call MD for:  difficulty breathing, headache or visual disturbances   Complete by:  As directed    Call MD for:  persistant dizziness or light-headedness   Complete by:  As directed    Call MD for:  redness, tenderness, or signs of infection (pain, swelling, redness, odor or green/yellow discharge around incision site)   Complete by:  As directed    Call MD for:  severe uncontrolled pain   Complete by:  As directed    Call MD for:  temperature >100.4   Complete by:  As directed    Diet general   Complete by:  As directed    Driving Restrictions   Complete by:  As directed    Do not drive until given clearance.   Increase activity slowly   Complete by:  As directed    Lifting restrictions   Complete by:  As directed    Do not lift anything >10lbs. Avoid bending and twisting in awkward positions. Avoid bending at the  back.   May shower / Bathe   Complete by:  As directed    In 24 hours. Okay to wash wound with warm soapy water. Avoid scrubbing the wound. Pat dry.   Remove dressing in 24 hours   Complete by:  As directed      Allergies as of 12/05/2017      Reactions   Belladonna Other (See Comments)   blurred vision/buring    Hctz [hydrochlorothiazide] Other (See Comments)   Abdominal pain.   Phenobarbital Rash, Other (See Comments)   rash on hands and feet, water retention   Quinine Rash      Medication List    TAKE these medications   levETIRAcetam 500 MG tablet Commonly known as:  KEPPRA Take 1 tablet (500 mg total) by mouth 2 (two) times daily.   losartan 100 MG tablet Commonly known as:  COZAAR Take 1 tablet (100 mg total) by mouth daily.   multivitamin with minerals Tabs tablet Take 1 tablet by mouth daily at 12 noon. Centrum Silver   oxyCODONE-acetaminophen 7.5-325 MG tablet Commonly known as:  PERCOCET Take 1 tablet by mouth every 4 (four) hours as needed.   PROBIOTIC PO Take 1 capsule by mouth daily at 12 noon.      Follow-up Information    Consuella Lose, MD. Schedule an appointment as soon as possible for a visit in 3 week(s).   Specialty:  Neurosurgery Contact information: [address]  [place] [address] [phone]           Signed: Traci Sermon 12/05/2017, 8:58 AM
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
## 2017-12-05 NOTE — Progress Notes (Signed)
Patient given d/c instructions, including printed prescriptions. No equipment to give.  IV removed.  Questions answered.  Patient taken to car via w/c.

## 2017-12-05 NOTE — Progress Notes (Signed)
Neurosurgery Progress Note  No issues overnight.  Mild HA 1-2/10 No concerns this am  EXAM:  BP (!) 146/70 (BP Location: Right Arm)   Pulse 61   Temp 98.4 F (36.9 C) (Oral)   Resp 18   Ht 5\' 2"  (1.575 m)   Wt 109.9 kg   SpO2 98%   BMI 44.32 kg/m   Awake, alert, oriented  Speech fluent, appropriate  CN grossly intact  5/5 BUE/BLE  No drift Incision c/d/i  PLAN Doing well this am Stable for d/c

## 2017-12-05 NOTE — Care Management Important Message (Signed)
Important Message  Patient Details  Name: Angelica Harvey MRN: [id number] Date of Birth: 04-14-1950   Medicare Important Message Given:  Yes    Orbie Pyo 12/05/2017, 4:07 PM

## 2017-12-05 NOTE — Progress Notes (Addendum)
Called CT to get update on patient's CTA order. CT/transport with several patient's ahead at this time, will give patient update.   Spoke with PA, patient can be discharged after CTA results if negative.
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
## 2017-12-07 ENCOUNTER — Telehealth: Payer: Self-pay | Admitting: Internal Medicine

## 2017-12-07 NOTE — Telephone Encounter (Signed)
Returned pt call and answered pt questions and she doesn't have any questions or concerns

## 2017-12-07 NOTE — Telephone Encounter (Signed)
Pt called to request a nurse call back in regards to a craniotomy she had on 12/02/2017, please follow up when possible

## 2017-12-15 ENCOUNTER — Other Ambulatory Visit: Payer: Self-pay | Admitting: Radiation Therapy

## 2017-12-19 ENCOUNTER — Other Ambulatory Visit: Payer: Medicare Other

## 2017-12-20 NOTE — Progress Notes (Signed)
Brain and Spine Tumor Board Documentation  Angelica Harvey was presented by Cecil Cobbs, MD at Brain and Spine Tumor Board on 12/20/2017, which included representatives from neuro oncology, radiation oncology, surgical oncology, radiology, pathology, navigation, genetics.  Angelica Harvey was presented as a new patient with history of the following treatments:  .  Additionally, we reviewed previous medical and familial history, history of present illness, and recent lab results along with all available histopathologic and imaging studies. The tumor board considered available treatment options and made the following recommendations:  Active surveillance  Tumor board is a meeting of clinicians from various specialty areas who evaluate and discuss patients for whom a multidisciplinary approach is being considered. Final determinations in the plan of care are those of the provider(s). The responsibility for follow up of recommendations given during tumor board is that of the provider.   Today's extended care, comprehensive team conference, Angelica Harvey was not present for the discussion and was not examined.

## 2018-01-16 ENCOUNTER — Encounter: Payer: Self-pay | Admitting: Internal Medicine

## 2018-01-16 ENCOUNTER — Ambulatory Visit: Payer: Medicare Other | Attending: Internal Medicine | Admitting: Internal Medicine

## 2018-01-16 VITALS — BP 148/82 | HR 77 | Temp 98.0°F | Resp 16 | Ht 62.0 in | Wt 227.2 lb

## 2018-01-16 DIAGNOSIS — F419 Anxiety disorder, unspecified: Secondary | ICD-10-CM

## 2018-01-16 DIAGNOSIS — Z87891 Personal history of nicotine dependence: Secondary | ICD-10-CM | POA: Insufficient documentation

## 2018-01-16 DIAGNOSIS — R0683 Snoring: Secondary | ICD-10-CM | POA: Diagnosis not present

## 2018-01-16 DIAGNOSIS — G4710 Hypersomnia, unspecified: Secondary | ICD-10-CM | POA: Insufficient documentation

## 2018-01-16 DIAGNOSIS — R400 Somnolence: Secondary | ICD-10-CM | POA: Diagnosis not present

## 2018-01-16 DIAGNOSIS — I10 Essential (primary) hypertension: Secondary | ICD-10-CM

## 2018-01-16 DIAGNOSIS — Z6841 Body Mass Index (BMI) 40.0 and over, adult: Secondary | ICD-10-CM

## 2018-01-16 DIAGNOSIS — Z2821 Immunization not carried out because of patient refusal: Secondary | ICD-10-CM

## 2018-01-16 NOTE — Progress Notes (Signed)
Patient ID: Angelica Harvey, female    DOB: March 14, 1951  MRN: [id number]  CC: Follow-up   Subjective: Angelica Harvey is a 67 y.o. female who presents for chronic ds management. Her concerns today include:  Pt with hx of HTN, HL, obesity and plantar fasciitis.   Since last visit she had meningioma removed. Pt states she was told to be checked for OSA because dec O2 level post surgery. Family tells her that she snores very loud.  Never told that she quite breathing during sleep.  Wakes up feeling unrefresh and feels sleepy during the day.  -On Keppra for seizure prophylaxis.  The Keppra has been causing increase in her anxiety.  She has also noted decreased appetite with it and has lost 20 pounds since I last saw her.  She is happy about the 20 pounds weight loss but does not like the increased anxiety feelings.  She spoke with the neurologist about it and he is told her to taper off the [place] which she has started doing.  HTN: Has a wrist cuff but not checking BP.  feels her BP is up because she is not very active.  Wants to go to gym but her neurosurgeon told her it was too soon.  She was told however that she can walk for exercise. Loss 20 lbs since last visit in 10/2017.  Thinks it is due to decrease appetite from Keppra Reports compliance with Cozaar. Patient Active Problem List   Diagnosis Date Noted  . Status post craniotomy 12/02/2017  . Meningioma (Angelica Harvey) 12/02/2017  . Plantar fasciitis of left foot 12/01/2015  . Insomnia 06/26/2015  . Severe obesity (BMI >= 40) ([place]) 01/23/2015  . GERD (gastroesophageal reflux disease) 08/22/2013  . Depression 06/20/2013  . Dyslipidemia 09/19/2012  . Essential hypertension 07/22/2008     Current Outpatient Medications on File Prior to Visit  Medication Sig Dispense Refill  . levETIRAcetam (KEPPRA) 500 MG tablet Take 1 tablet (500 mg total) by mouth 2 (two) times daily. 60 tablet 2  . losartan (COZAAR) 100 MG tablet Take 1 tablet (100 mg total) by  mouth daily. 90 tablet 1  . Multiple Vitamin (MULTIVITAMIN WITH MINERALS) TABS tablet Take 1 tablet by mouth daily at 12 noon. Centrum Silver    . Probiotic Product (PROBIOTIC PO) Take 1 capsule by mouth daily at 12 noon.     No current facility-administered medications on file prior to visit.     Allergies  Allergen Reactions  . Belladonna Other (See Comments)    blurred vision/buring   . Hctz [Hydrochlorothiazide] Other (See Comments)    Abdominal pain.  . Phenobarbital Rash and Other (See Comments)    rash on hands and feet, water retention  . Quinine Rash    Social History   Socioeconomic History  . Marital status: Married    Spouse name: Not on file  . Number of children: 1  . Years of education: Not on file  . Highest education level: Not on file  Occupational History  . Occupation: retired  Scientific laboratory technician  . Financial resource strain: Not hard at all  . Food insecurity:    Worry: Never true    Inability: Never true  . Transportation needs:    Medical: No    Non-medical: No  Tobacco Use  . Smoking status: Former Smoker    Packs/day: 1.00    Years: 17.00    Pack years: 17.00    Types: Cigarettes    Last  attempt to quit: 04/12/1990    Years since quitting: 27.7  . Smokeless tobacco: Never Used  Substance and Sexual Activity  . Alcohol use: Yes    Comment: ocassional wine   . Drug use: No  . Sexual activity: Not on file  Lifestyle  . Physical activity:    Days per week: Patient refused    Minutes per session: Patient refused  . Stress: Rather much  Relationships  . Social connections:    Talks on phone: Patient refused    Gets together: Patient refused    Attends religious service: Patient refused    Active member of club or organization: Patient refused    Attends meetings of clubs or organizations: Patient refused    Relationship status: Married  . Intimate partner violence:    Fear of current or ex partner: No    Emotionally abused: No    Physically  abused: No    Forced sexual activity: No  Other Topics Concern  . Not on file  Social History Narrative  . Not on file    Family History  Problem Relation Age of Onset  . Heart attack Father   . Heart disease Father        CABG  . Colon polyps Mother   . Kidney disease Sister        dialysis  . Colon cancer Maternal Aunt        x 2  . Esophageal cancer Neg Hx   . Stomach cancer Neg Hx   . Rectal cancer Neg Hx     Past Surgical History:  Procedure Laterality Date  . APPLICATION OF CRANIAL NAVIGATION N/A 12/02/2017   Procedure: APPLICATION OF CRANIAL NAVIGATION;  Surgeon: Consuella Lose, MD;  Location: [facility];  Service: Neurosurgery;  Laterality: N/A;  . cataract surgery Bilateral   . CESAREAN SECTION     x 1  . COLONOSCOPY  08/2005   Olevia Perches- hx polyps  . CRANIOTOMY Right 12/02/2017   Procedure: STEREOTACTIC RIGHT PTERIONAL CRANIOTOMY FOR RESECTION OF MENINGIOMA;  Surgeon: Consuella Lose, MD;  Location: [facility];  Service: Neurosurgery;  Laterality: Right;  . EYE SURGERY Bilateral    Cataract surgery     ROS: Review of Systems Negative except as above. PHYSICAL EXAM: BP (!) 148/82   Pulse 77   Temp 98 F (36.7 C) (Oral)   Resp 16   Ht 5\' 2"  (1.575 m)   Wt 227 lb 3.2 oz (103.1 kg)   SpO2 95%   BMI 41.56 kg/m   Wt Readings from Last 3 Encounters:  01/16/18 227 lb 3.2 oz (103.1 kg)  12/02/17 242 lb 4.8 oz (109.9 kg)  11/24/17 242 lb 4.8 oz (109.9 kg)    Physical Exam  General appearance - alert, well appearing, and in no distress Mental status - normal mood, behavior, speech, dress, motor activity, and thought processes Neck - supple, no significant adenopathy Chest - clear to auscultation, no wheezes, rales or rhonchi, symmetric air entry Heart - normal rate, regular rhythm, normal S1, S2, no murmurs, rubs, clicks or gallops Extremities - peripheral pulses normal, no pedal edema, no clubbing or cyanosis Skin -patient has healed scar in the right  temporoparietal area.  2 scabbed areas remain.  ASSESSMENT AND PLAN: 1. Essential hypertension Not at goal.  Advised patient to check blood pressure daily and record the readings.  She will bring in readings on next visit with me.  Advised to limit salt in the foods.  2. Habitual  snoring Symptoms highly suggestive of sleep apnea.  Will refer for home sleep study. - Home sleep test  3. Daytime sleepiness Will refer for home sleep study. - Home sleep test  4. Class 3 severe obesity without serious comorbidity with body mass index (BMI) of 40.0 to 44.9 in adult, unspecified obesity type ([place]) Encourage healthy eating habits.  Advised to walk several times a week if only for 10 minutes.  She plans to join the gym once the neurologist tells her it is safe to do so.  5. Anxiety Patient declines any medication for anxiety.  She wants to hold off and see whether this improves when she is weaned off the [place].  6. Influenza vaccination declined  Patient was given the opportunity to ask questions.  Patient verbalized understanding of the plan and was able to repeat key elements of the plan.   Orders Placed This Encounter  Procedures  . Home sleep test     Requested Prescriptions    No prescriptions requested or ordered in this encounter    Return in about 1 month (around 02/16/2018).  Karle Plumber, MD, FACP

## 2018-01-16 NOTE — Patient Instructions (Signed)
Please check your blood pressure daily and record the readings.  Please bring them with you on your next visit. You have been referred for sleep study.  Follow a Healthy Eating Plan - You can do it! Limit sugary drinks.  Avoid sodas, sweet tea, sport or energy drinks, or fruit drinks.  Drink water, lo-fat milk, or diet drinks. Limit snack foods.   Cut back on candy, cake, cookies, chips, ice cream.  These are a special treat, only in small amounts. Eat plenty of vegetables.  Especially dark green, red, and orange vegetables. Aim for at least 3 servings a day. More is better! Include fruit in your daily diet.  Whole fruit is much healthier than fruit juice! Limit "white" bread, "white" pasta, "white" rice.   Choose "100% whole grain" products, brown or wild rice. Avoid fatty meats. Try "Meatless Monday" and choose eggs or beans one day a week.  When eating meat, choose lean meats like chicken, [place], and fish.  Grill, broil, or bake meats instead of frying, and eat poultry without the skin. Eat less salt.  Avoid frozen pizzas, frozen dinners and salty foods.  Use seasonings other than salt in cooking.  This can help blood pressure and keep you from swelling Beer, wine and liquor have calories.  If you can safely drink alcohol, limit to 1 drink per day for women, 2 drinks for men

## 2018-01-16 NOTE — Progress Notes (Signed)
Pt. Is here for a 3 months follow-up.  Pt. Stated she had a craniotomy done on 09.23/2019 and would like PCP check on her.   Pt. Stated she have blur vision when she lay down and watch TV.

## 2018-01-18 ENCOUNTER — Observation Stay (HOSPITAL_COMMUNITY)
Admission: EM | Admit: 2018-01-18 | Discharge: 2018-01-19 | Disposition: A | Discharge status: Home / Self Care | Payer: Medicare Other | Attending: Internal Medicine | Admitting: Internal Medicine

## 2018-01-18 ENCOUNTER — Other Ambulatory Visit: Payer: Self-pay

## 2018-01-18 ENCOUNTER — Encounter (HOSPITAL_COMMUNITY): Payer: Self-pay | Admitting: Emergency Medicine

## 2018-01-18 ENCOUNTER — Emergency Department (HOSPITAL_COMMUNITY): Payer: Medicare Other

## 2018-01-18 DIAGNOSIS — R079 Chest pain, unspecified: Secondary | ICD-10-CM | POA: Diagnosis not present

## 2018-01-18 DIAGNOSIS — E785 Hyperlipidemia, unspecified: Secondary | ICD-10-CM | POA: Insufficient documentation

## 2018-01-18 DIAGNOSIS — R0981 Nasal congestion: Secondary | ICD-10-CM | POA: Diagnosis not present

## 2018-01-18 DIAGNOSIS — Z6841 Body Mass Index (BMI) 40.0 and over, adult: Secondary | ICD-10-CM | POA: Insufficient documentation

## 2018-01-18 DIAGNOSIS — Z87891 Personal history of nicotine dependence: Secondary | ICD-10-CM | POA: Diagnosis not present

## 2018-01-18 DIAGNOSIS — Z888 Allergy status to other drugs, medicaments and biological substances status: Secondary | ICD-10-CM | POA: Insufficient documentation

## 2018-01-18 DIAGNOSIS — B9689 Other specified bacterial agents as the cause of diseases classified elsewhere: Secondary | ICD-10-CM | POA: Insufficient documentation

## 2018-01-18 DIAGNOSIS — Z86010 Personal history of colonic polyps: Secondary | ICD-10-CM | POA: Insufficient documentation

## 2018-01-18 DIAGNOSIS — R0789 Other chest pain: Principal | ICD-10-CM | POA: Insufficient documentation

## 2018-01-18 DIAGNOSIS — I440 Atrioventricular block, first degree: Secondary | ICD-10-CM | POA: Insufficient documentation

## 2018-01-18 DIAGNOSIS — Z79899 Other long term (current) drug therapy: Secondary | ICD-10-CM | POA: Diagnosis not present

## 2018-01-18 DIAGNOSIS — Z9889 Other specified postprocedural states: Secondary | ICD-10-CM

## 2018-01-18 DIAGNOSIS — D329 Benign neoplasm of meninges, unspecified: Secondary | ICD-10-CM | POA: Diagnosis present

## 2018-01-18 DIAGNOSIS — Z8249 Family history of ischemic heart disease and other diseases of the circulatory system: Secondary | ICD-10-CM | POA: Diagnosis not present

## 2018-01-18 DIAGNOSIS — G4700 Insomnia, unspecified: Secondary | ICD-10-CM | POA: Insufficient documentation

## 2018-01-18 DIAGNOSIS — H269 Unspecified cataract: Secondary | ICD-10-CM | POA: Insufficient documentation

## 2018-01-18 DIAGNOSIS — N390 Urinary tract infection, site not specified: Secondary | ICD-10-CM | POA: Diagnosis not present

## 2018-01-18 DIAGNOSIS — Z8371 Family history of colonic polyps: Secondary | ICD-10-CM | POA: Diagnosis not present

## 2018-01-18 DIAGNOSIS — R0602 Shortness of breath: Secondary | ICD-10-CM | POA: Diagnosis not present

## 2018-01-18 DIAGNOSIS — R0600 Dyspnea, unspecified: Secondary | ICD-10-CM | POA: Diagnosis present

## 2018-01-18 DIAGNOSIS — I10 Essential (primary) hypertension: Secondary | ICD-10-CM | POA: Diagnosis not present

## 2018-01-18 LAB — URINALYSIS, ROUTINE W REFLEX MICROSCOPIC
BILIRUBIN URINE: NEGATIVE
GLUCOSE, UA: NEGATIVE mg/dL
Ketones, ur: 5 mg/dL — AB
NITRITE: POSITIVE — AB
PROTEIN: NEGATIVE mg/dL
Specific Gravity, Urine: 1.01 (ref 1.005–1.030)
pH: 6 (ref 5.0–8.0)

## 2018-01-18 LAB — CBC WITH DIFFERENTIAL/PLATELET
Abs Immature Granulocytes: 0.04 10*3/uL (ref 0.00–0.07)
Basophils Absolute: 0 10*3/uL (ref 0.0–0.1)
Basophils Relative: 0 %
EOS ABS: 0 10*3/uL (ref 0.0–0.5)
EOS PCT: 0 %
HCT: 42.8 % (ref 36.0–46.0)
Hemoglobin: 13.7 g/dL (ref 12.0–15.0)
Immature Granulocytes: 0 %
Lymphocytes Relative: 21 %
Lymphs Abs: 2.1 10*3/uL (ref 0.7–4.0)
MCH: 27.8 pg (ref 26.0–34.0)
MCHC: 32 g/dL (ref 30.0–36.0)
MCV: 86.8 fL (ref 80.0–100.0)
MONO ABS: 0.6 10*3/uL (ref 0.1–1.0)
Monocytes Relative: 6 %
NRBC: 0 % (ref 0.0–0.2)
Neutro Abs: 7 10*3/uL (ref 1.7–7.7)
Neutrophils Relative %: 73 %
Platelets: 223 10*3/uL (ref 150–400)
RBC: 4.93 MIL/uL (ref 3.87–5.11)
RDW: 13.1 % (ref 11.5–15.5)
WBC: 9.8 10*3/uL (ref 4.0–10.5)

## 2018-01-18 LAB — BASIC METABOLIC PANEL
Anion gap: 10 (ref 5–15)
BUN: 7 mg/dL — AB (ref 8–23)
CHLORIDE: 105 mmol/L (ref 98–111)
CO2: 23 mmol/L (ref 22–32)
CREATININE: 0.85 mg/dL (ref 0.44–1.00)
Calcium: 9.2 mg/dL (ref 8.9–10.3)
GFR calc Af Amer: >60 mL/min (ref >60)
GFR calc non Af Amer: >60 mL/min (ref >60)
GLUCOSE: 103 mg/dL — AB (ref 70–99)
POTASSIUM: 3.5 mmol/L (ref 3.5–5.1)
SODIUM: 138 mmol/L (ref 135–145)

## 2018-01-18 LAB — D-DIMER, QUANTITATIVE: D-Dimer, Quant: 0.38 ug/mL-FEU (ref 0.00–0.50)

## 2018-01-18 LAB — BRAIN NATRIURETIC PEPTIDE: B NATRIURETIC PEPTIDE 5: 48.1 pg/mL (ref 0.0–100.0)

## 2018-01-18 LAB — I-STAT TROPONIN, ED: TROPONIN I, POC: 0 ng/mL (ref 0.00–0.08)

## 2018-01-18 LAB — TROPONIN I: Troponin I: <0.03 ng/mL (ref <0.03)

## 2018-01-18 MED ORDER — NITROGLYCERIN 0.4 MG SL SUBL: 0.4000 mg | SUBLINGUAL_TABLET | SUBLINGUAL | Status: DC | PRN

## 2018-01-18 MED ORDER — LOSARTAN POTASSIUM 50 MG PO TABS
100.0000 mg | ORAL_TABLET | Freq: Every day | ORAL | Status: DC
  Administered 2018-01-19: 100 mg via ORAL
  Filled 2018-01-18: qty 2

## 2018-01-18 MED ORDER — LORATADINE 10 MG PO TABS
10.0000 mg | ORAL_TABLET | Freq: Every day | ORAL | Status: DC
  Administered 2018-01-19 (×2): 10 mg via ORAL
  Filled 2018-01-18 (×2): qty 1

## 2018-01-18 MED ORDER — HYDRALAZINE HCL 20 MG/ML IJ SOLN: 10.0000 mg | INTRAMUSCULAR | Status: DC | PRN

## 2018-01-18 MED ORDER — ACETAMINOPHEN 325 MG PO TABS: 650.0000 mg | ORAL_TABLET | ORAL | Status: DC | PRN

## 2018-01-18 MED ORDER — ALBUTEROL SULFATE (2.5 MG/3ML) 0.083% IN NEBU: 2.5000 mg | INHALATION_SOLUTION | Freq: Four times a day (QID) | RESPIRATORY_TRACT | Status: DC | PRN

## 2018-01-18 MED ORDER — ONDANSETRON HCL 4 MG/2ML IJ SOLN: 4.0000 mg | Freq: Four times a day (QID) | INTRAMUSCULAR | Status: DC | PRN

## 2018-01-18 MED ORDER — MORPHINE SULFATE (PF) 2 MG/ML IV SOLN: 2.0000 mg | INTRAVENOUS | Status: DC | PRN

## 2018-01-18 MED ORDER — ENOXAPARIN SODIUM 40 MG/0.4ML ~~LOC~~ SOLN
40.0000 mg | SUBCUTANEOUS | Status: DC
  Administered 2018-01-19: 40 mg via SUBCUTANEOUS
  Filled 2018-01-18: qty 0.4

## 2018-01-18 NOTE — ED Notes (Signed)
MD Smith at bedside.

## 2018-01-18 NOTE — ED Notes (Signed)
ED Provider at bedside. 

## 2018-01-18 NOTE — ED Triage Notes (Addendum)
Per EMS, pt coming from home with complaints of CP and SOB for the last two days. Pt recently had a craniotomy done 6 weeks ago for a benign tumor. Pt has been weaning off of keppra this week per their neurologist. Pt received 1 nitro and 324 ASA via EMS. No complaints of CP at this time.

## 2018-01-18 NOTE — H&P (Signed)
History and Physical    Angelica Harvey:[id number] DOB: 1951-03-26 DOA: 01/18/2018  Referring MD/NP/PA: Benjamine Mola, MD (resident) PCP: Ladell Pier, MD  Patient coming from: Home via EMS  Chief Complaint: Chest pressure and shortness of breath I have personally briefly reviewed patient's old medical records in [place]   HPI: Angelica Harvey is a 67 y.o. female with medical history significant of HTN, HLD, anxiety, depression, and recent craniotomy for resection of extra-axial tumor on 8/26; who presents with complaints of chest pressure and shortness of breath starting this afternoon.  Patient reports symptoms started following her brain surgery where she was started on a prophylactic dose of Keppra.  The tumor was noted to be a benign meningioma.  A few weeks thereafter patient reports having issues with anxiety, anger, diplopia, and blurred vision.  She investigated the side effects of Keppra, and found that it could cause a lot of her symptoms.  She told her neurologist who started to wean her off the medication.  At that time he told her to take once daily for 1 week, then once every other day, then stop.  She reports having 2 more doses every other day to take for completion of taper.  Over the last 2 days she has been more fatigued than normal and reports having insomnia with associated symptoms of urinary frequency getting up every hour.  Denies any dysuria or discharge symptoms.  Furthermore patient complains of having elevated systolic blood pressures into the 200s.  Today when she woke up she felt generalized malaise and reports having left-sided chest pressure with difficulty breathing.  Pressure did not radiate.  She tried taking deep breaths, but reports no improvement in symptoms.  Also noted associated symptoms of sinus congestion, cough, nausea, and feeling shaky all over.  Patient called her provider who recommended she come into the emergency department for further evaluation.   She reports previously having issue with chest pain several years ago, but was evaluated by cardiologist and told symptoms were likely related to indigestion.  She has not had similar symptoms like this recently, and did not undergo stress testing.  Denies having any diaphoresis, wheezing, fever, chills, vomiting, diarrhea, or recent sick contacts.  In route with EMS patient had been given 1 nitroglycerin and 324 mg of aspirin to chew.  ED Course: Upon admission into the emergency department patient was noted to be afebrile, pulse 62-71, respirations 11-22, O2 saturations 92-98% on room air, and blood pressure maintained.  Labs revealed negative initial troponin.  CBC, BNP, and BMP were otherwise unremarkable.  Chest x-ray showed no acute abnormalities.  Review of Systems  Constitutional: Positive for malaise/fatigue. Negative for chills and fever.  HENT: Positive for congestion. Negative for ear pain.   Eyes: Positive for blurred vision and double vision.  Respiratory: Positive for cough and shortness of breath. Negative for sputum production and wheezing.   Cardiovascular: Negative for chest pain and leg swelling.       Chest pressure  Gastrointestinal: Positive for nausea. Negative for abdominal pain and vomiting.  Genitourinary: Positive for frequency. Negative for dysuria.  Musculoskeletal: Negative for falls and myalgias.  Skin: Negative for itching.  Neurological: Positive for weakness. Negative for seizures, loss of consciousness and headaches.  Psychiatric/Behavioral: Negative for substance abuse. The patient is nervous/anxious and has insomnia.     Past Medical History:  Diagnosis Date  . Anxiety   . Cataract    surgery - bilateral  . Colon polyps   .  Depression   . History of hypokalemia   . Hypercholesterolemia    diet controlled - no meds  . Hypertension   . Obesity   . Pneumonia     Past Surgical History:  Procedure Laterality Date  . APPLICATION OF CRANIAL  NAVIGATION N/A 12/02/2017   Procedure: APPLICATION OF CRANIAL NAVIGATION;  Surgeon: Consuella Lose, MD;  Location: [facility];  Service: Neurosurgery;  Laterality: N/A;  . cataract surgery Bilateral   . CESAREAN SECTION     x 1  . COLONOSCOPY  08/2005   Olevia Perches- hx polyps  . CRANIOTOMY Right 12/02/2017   Procedure: STEREOTACTIC RIGHT PTERIONAL CRANIOTOMY FOR RESECTION OF MENINGIOMA;  Surgeon: Consuella Lose, MD;  Location: [facility];  Service: Neurosurgery;  Laterality: Right;  . EYE SURGERY Bilateral    Cataract surgery      reports that she quit smoking about 27 years ago. Her smoking use included cigarettes. She has a 17.00 pack-year smoking history. She has never used smokeless tobacco. She reports that she drinks alcohol. She reports that she does not use drugs.  Allergies  Allergen Reactions  . Belladonna Other (See Comments)    blurred vision/buring   . Hctz [Hydrochlorothiazide] Other (See Comments)    Abdominal pain.  . Phenobarbital Rash and Other (See Comments)    rash on hands and feet, water retention  . Quinine Rash    Family History  Problem Relation Age of Onset  . Heart attack Father   . Heart disease Father        CABG  . Colon polyps Mother   . Kidney disease Sister        dialysis  . Colon cancer Maternal Aunt        x 2  . Esophageal cancer Neg Hx   . Stomach cancer Neg Hx   . Rectal cancer Neg Hx     Prior to Admission medications   Medication Sig Start Date End Date Taking? Authorizing Provider  levETIRAcetam (KEPPRA) 500 MG tablet Take 1 tablet (500 mg total) by mouth 2 (two) times daily. 12/05/17   Costella, Vista Mink, PA-C  losartan (COZAAR) 100 MG tablet Take 1 tablet (100 mg total) by mouth daily. 11/18/17   Ladell Pier, MD  Multiple Vitamin (MULTIVITAMIN WITH MINERALS) TABS tablet Take 1 tablet by mouth daily at 12 noon. Centrum Silver    [provider]  Probiotic Product (PROBIOTIC PO) Take 1 capsule by mouth daily at 12 noon.     [provider]    Physical Exam:  Constitutional: Elderly female who appears to be in very anxious, but able to follow commands. Vitals:   01/18/18 1900 01/18/18 1915 01/18/18 1930 01/18/18 1945  BP: (!) 130/57 (!) 131/55 (!) 137/55 121/63  Pulse: 66 67 62 64  Resp: 14 20 20  (!) 22  Temp:      TempSrc:      SpO2: 97% 92% 97% 92%  Weight:      Height:       Eyes: PERRL, lids and conjunctivae normal ENMT: Mucous membranes are moist. Posterior pharynx clear of any exudate or lesions.  Neck: normal, supple, no masses, no thyromegaly.  No JVD. Respiratory: clear to auscultation bilaterally, no wheezing, no crackles. Normal respiratory effort. No accessory muscle use.  Cardiovascular: Regular rate and rhythm, no murmurs / rubs / gallops. No extremity edema. 2+ pedal pulses. No carotid bruits.  Chest pain not reproducible with palpation. Abdomen: no tenderness, no masses palpated. No hepatosplenomegaly.  Bowel sounds positive.  Musculoskeletal: no clubbing / cyanosis. No joint deformity upper and lower extremities. Good ROM, no contractures. Normal muscle tone.  Skin: no rashes, lesions, ulcers. No induration Neurologic: CN 2-12 grossly intact. Sensation intact, DTR normal. Strength 5/5 in all 4.  Psychiatric: Normal judgment and insight. Alert and oriented x 3.  Anxious mood.     Labs on Admission: I have personally reviewed following labs and imaging studies  CBC: Recent Labs  Lab 01/18/18 1825  WBC 9.8  NEUTROABS 7.0  HGB 13.7  HCT 42.8  MCV 86.8  PLT 119   Basic Metabolic Panel: Recent Labs  Lab 01/18/18 1825  NA 138  K 3.5  CL 105  CO2 23  GLUCOSE 103*  BUN 7*  CREATININE 0.85  CALCIUM 9.2   GFR: Estimated Creatinine Clearance: 73.3 mL/min (by C-G formula based on SCr of 0.85 mg/dL). Liver Function Tests: No results for input(s): AST, ALT, ALKPHOS, BILITOT, PROT, ALBUMIN in the last 168 hours. No results for input(s): LIPASE, AMYLASE in the last 168  hours. No results for input(s): AMMONIA in the last 168 hours. Coagulation Profile: No results for input(s): INR, PROTIME in the last 168 hours. Cardiac Enzymes: No results for input(s): CKTOTAL, CKMB, CKMBINDEX, TROPONINI in the last 168 hours. BNP (last 3 results) No results for input(s): PROBNP in the last 8760 hours. HbA1C: No results for input(s): HGBA1C in the last 72 hours. CBG: No results for input(s): GLUCAP in the last 168 hours. Lipid Profile: No results for input(s): CHOL, HDL, LDLCALC, TRIG, CHOLHDL, LDLDIRECT in the last 72 hours. Thyroid Function Tests: No results for input(s): TSH, T4TOTAL, FREET4, T3FREE, THYROIDAB in the last 72 hours. Anemia Panel: No results for input(s): VITAMINB12, FOLATE, FERRITIN, TIBC, IRON, RETICCTPCT in the last 72 hours. Urine analysis:    Component Value Date/Time   COLORURINE YELLOW 02/15/2009 1609   APPEARANCEUR CLOUDY (A) 02/15/2009 1609   LABSPEC 1.011 02/15/2009 1609   PHURINE 6.0 02/15/2009 1609   GLUCOSEU NEGATIVE 02/15/2009 1609   HGBUR MODERATE (A) 02/15/2009 1609   BILIRUBINUR NEGATIVE 02/15/2009 1609   KETONESUR NEGATIVE 02/15/2009 1609   PROTEINUR NEGATIVE 02/15/2009 1609   UROBILINOGEN 0.2 02/15/2009 1609   NITRITE NEGATIVE 02/15/2009 1609   LEUKOCYTESUR MODERATE (A) 02/15/2009 1609   Sepsis Labs: No results found for this or any previous visit (from the past 240 hour(s)).   Radiological Exams on Admission: Dg Chest 2 View  Result Date: 01/18/2018 CLINICAL DATA:  Chest tightness and shortness of breath for several days EXAM: CHEST - 2 VIEW COMPARISON:  CT from 12/05/2017 FINDINGS: Cardiac shadow is within normal limits. The lungs are clear bilaterally. No focal infiltrate or effusion is seen. Degenerative changes of the thoracic spine are noted. IMPRESSION: No active cardiopulmonary disease. Electronically Signed   By: Inez Catalina M.D.   On: 01/18/2018 18:31    EKG: Independently reviewed.  Sinus rhythm at 72 bpm  with first-degree heart block.  Assessment/Plan Chest pressure with dyspnea: Acute.  Patient presents with complaints of chest pressure, shortness of breath, and nausea.  Lungs sound clear.  Chest x-ray shows no acute abnormalities. ` EKG showing no clear signs of ischemia and initial troponin negative.  Heart score = 4. - Admit to a telemetry bed - Add-on d-dimer, and if positive we will check CT angiogram of the chest - Trend cardiac troponin-I every 3 hours x3 overnight - Check lipid panel in a.m. - Determine if echocardiogram warranted in a.m. - Message  sent for cardiology to eval in a.m. - Nitro and morphine as needed for chest pain  Benign meningioma status post craniotomy with resection: Patient status post resection of benign meningioma on 8/23 by Dr. Carroll Kinds. She had been on prophylactic Keppra, but weaning due to reported side effects including double vision, blurred vision, anxiety, anger, fatigue. - Hold Keppra  - May warrant repeat imaging of her brain  Urinary frequency/possible UTI: Acute.  Patient reports urinary frequency for the last 2 days but denies any dysuria. - Check urinalysis - If positive will start on Rocephin IV,  and check urine culture  Essential hypertension: Blood pressures noted to be as high as 156/83. - Continue losartan  Sinus congestion: Acute.  No clear signs of infection. Suspect likely related with recent weather changes  - Claritin  Morbid obesity: BMI of 41.57 - Encouraged on need of weight loss   DVT prophylaxis: Lovenox Code Status: Full  Family Communication: No family present at bedside Disposition Plan: Likely, discharge home if work-up negative Consults called: none Admission status: observation  Norval Morton MD [facility] Pager [phone]   If 7PM-7AM, please contact night-coverage [email or web address] Password [facility]  01/18/2018, 8:48 PM

## 2018-01-18 NOTE — ED Provider Notes (Signed)
[facility]ERGENC[facility] DEPARTMENT Provider Note   CSN: [id number] Arrival date & time: 01/18/18  1731     History   Chief Complaint Chief Complaint  Patient presents with  . Chest Pain  . Shortness of Breath    HPI Angelica Harvey is a 67 y.o. female.  HPI  Patient is a 67yo female with PMHx of HTN, HLD, depression, and recent craniotomy for resection of benign meningioma on 12/05/17 who presents with CP upon waking this morning.  Patient states it progressively worsened throughout the day and feels like "an elephant was sitting on her chest."  Pain described as pressure, located centrally/left sided, and resolved with nitro.  No prior cardiac hx.  She states it worsens when taking a deep breath. She c/o associated nausea.  Currently weaning off keppra due to side effects including anxiety, anger, and blurred vision.  She denies any recent fevers, chills, diaphoresis, SOB, V/D, abdominal pain, and all other complaints.  She was given nitro and full dose ASA PTA by EMS.   Past Medical History:  Diagnosis Date  . Anxiety   . Cataract    surgery - bilateral  . Colon polyps   . Depression   . History of hypokalemia   . Hypercholesterolemia    diet controlled - no meds  . Hypertension   . Obesity   . Pneumonia     Patient Active Problem List   Diagnosis Date Noted  . Dyspnea 01/19/2018  . Acute lower UTI 01/19/2018  . Chest pain 01/18/2018  . Status post craniotomy 12/02/2017  . Meningioma ([place]) 12/02/2017  . Plantar fasciitis of left foot 12/01/2015  . Insomnia 06/26/2015  . Severe obesity (BMI >= 40) ([place]) 01/23/2015  . GERD (gastroesophageal reflux disease) 08/22/2013  . Depression 06/20/2013  . Dyslipidemia 09/19/2012  . Essential hypertension 07/22/2008    Past Surgical History:  Procedure Laterality Date  . APPLICATION OF CRANIAL NAVIGATION N/A 12/02/2017   Procedure: APPLICATION OF CRANIAL NAVIGATION;  Surgeon: Consuella Lose, MD;   Location: [facility];  Service: Neurosurgery;  Laterality: N/A;  . cataract surgery Bilateral   . CESAREAN SECTION     x 1  . COLONOSCOPY  08/2005   Olevia Perches- hx polyps  . CRANIOTOMY Right 12/02/2017   Procedure: STEREOTACTIC RIGHT PTERIONAL CRANIOTOMY FOR RESECTION OF MENINGIOMA;  Surgeon: Consuella Lose, MD;  Location: [facility];  Service: Neurosurgery;  Laterality: Right;  . EYE SURGERY Bilateral    Cataract surgery      OB History    Gravida  2   Para  1   Term      Preterm      AB  1   Living  1     SAB      TAB  1   Ectopic      Multiple      Live Births               Home Medications    Prior to Admission medications   Medication Sig Start Date End Date Taking? Authorizing Provider  levETIRAcetam (KEPPRA) 500 MG tablet Take 1 tablet (500 mg total) by mouth 2 (two) times daily. Patient taking differently: Take 500 mg by mouth every other day.  12/05/17  Yes Costella, Vista Mink, PA-C  losartan (COZAAR) 100 MG tablet Take 1 tablet (100 mg total) by mouth daily. 11/18/17  Yes Ladell Pier, MD  Multiple Vitamins-Minerals (CENTRUM SILVER 50+WOMEN) TABS Take 1 tablet by mouth  daily.   Yes [provider]  Probiotic Product (PROBIOTIC PO) Take 1 capsule by mouth daily.    Yes [provider]    Family History Family History  Problem Relation Age of Onset  . Heart attack Father   . Heart disease Father        CABG  . Colon polyps Mother   . Kidney disease Sister        dialysis  . Colon cancer Maternal Aunt        x 2  . Esophageal cancer Neg Hx   . Stomach cancer Neg Hx   . Rectal cancer Neg Hx     Social History Social History   Tobacco Use  . Smoking status: Former Smoker    Packs/day: 1.00    Years: 17.00    Pack years: 17.00    Types: Cigarettes    Last attempt to quit: 04/12/1990    Years since quitting: 27.7  . Smokeless tobacco: Never Used  Substance Use Topics  . Alcohol use: Yes    Comment: ocassional wine   . Drug  use: No     Allergies   Prozac [fluoxetine]; Belladonna; Hctz [hydrochlorothiazide]; Keppra [levetiracetam]; Phenobarbital; and Quinine   Review of Systems Review of Systems  Constitutional: Positive for fatigue. Negative for chills and fever.  HENT: Negative for sore throat.   Eyes: Positive for visual disturbance (blurred). Negative for pain.  Respiratory: Negative for cough and shortness of breath.   Cardiovascular: Positive for chest pain. Negative for palpitations.  Gastrointestinal: Positive for nausea. Negative for abdominal pain, diarrhea and vomiting.  Genitourinary: Negative for dysuria and hematuria.  Musculoskeletal: Negative for arthralgias and back pain.  Skin: Negative for color change and rash.  Neurological: Negative for seizures and syncope.  All other systems reviewed and are negative.    Physical Exam Updated Vital Signs BP 140/67 (BP Location: Right Arm)   Pulse 68   Temp 97.9 F (36.6 C) (Oral)   Resp (!) 21   Ht 5\' 2"  (1.575 m)   Wt 103 kg Comment: Scale A  SpO2 99%   BMI 41.52 kg/m   Physical Exam  Constitutional: She is oriented to person, place, and time. She appears well-developed and well-nourished. No distress.  Obese Caucasian female.  HENT:  Head: Normocephalic and atraumatic.  Eyes: Pupils are equal, round, and reactive to light. Conjunctivae and EOM are normal.  Neck: Normal range of motion. Neck supple.  Cardiovascular: Normal rate, regular rhythm and intact distal pulses.  No murmur heard. Pulmonary/Chest: Effort normal and breath sounds normal. No respiratory distress.  Abdominal: Soft. She exhibits no distension and no mass. There is no tenderness.  Musculoskeletal: Normal range of motion. She exhibits no edema.  Neurological: She is alert and oriented to person, place, and time.  Skin: Skin is warm and dry. Capillary refill takes less than 2 seconds.  Healing craniotomy scar to R parietal region without overlying erythema or  discharge.  Psychiatric: She has a normal mood and affect.  Nursing note and vitals reviewed.    ED Treatments / Results  Labs (all labs ordered are listed, but only abnormal results are displayed) Labs Reviewed  BASIC METABOLIC PANEL - Abnormal; Notable for the following components:      Result Value   Glucose, Bld 103 (*)    BUN 7 (*)    All other components within normal limits  LIPID PANEL - Abnormal; Notable for the following components:   Cholesterol  207 (*)    Triglycerides 185 (*)    HDL 29 (*)    LDL Cholesterol 141 (*)    All other components within normal limits  BASIC METABOLIC PANEL - Abnormal; Notable for the following components:   Glucose, Bld 105 (*)    BUN 6 (*)    All other components within normal limits  URINALYSIS, ROUTINE W REFLEX MICROSCOPIC - Abnormal; Notable for the following components:   Hgb urine dipstick SMALL (*)    Ketones, ur 5 (*)    Nitrite POSITIVE (*)    Leukocytes, UA MODERATE (*)    Bacteria, UA MANY (*)    All other components within normal limits  URINE CULTURE  BRAIN NATRIURETIC PEPTIDE  CBC WITH DIFFERENTIAL/PLATELET  D-DIMER, QUANTITATIVE (NOT AT [facility])  TROPONIN I  TROPONIN I  TROPONIN I  CBC WITH DIFFERENTIAL/PLATELET  HIV ANTIBODY (ROUTINE TESTING W REFLEX)  I-STAT TROPONIN, ED    EKG EKG Interpretation  Date/Time:  Wednesday January 18 2018 17:42:12 EDT Ventricular Rate:  72 PR Interval:    QRS Duration: 91 QT Interval:  386 QTC Calculation: 423 R Axis:   66 Text Interpretation:  Sinus rhythm No significant change since last tracing Confirmed by Deno Etienne [phone]) on 01/18/2018 5:45:28 PM   Radiology Dg Chest 2 View  Result Date: 01/18/2018 CLINICAL DATA:  Chest tightness and shortness of breath for several days EXAM: CHEST - 2 VIEW COMPARISON:  CT from 12/05/2017 FINDINGS: Cardiac shadow is within normal limits. The lungs are clear bilaterally. No focal infiltrate or effusion is seen. Degenerative changes of  the thoracic spine are noted. IMPRESSION: No active cardiopulmonary disease. Electronically Signed   By: Inez Catalina M.D.   On: 01/18/2018 18:31    Procedures Procedures (including critical care time)  Medications Ordered in ED Medications  acetaminophen (TYLENOL) tablet 650 mg (has no administration in time range)  ondansetron (ZOFRAN) injection 4 mg (has no administration in time range)  enoxaparin (LOVENOX) injection 40 mg (40 mg Subcutaneous Given 01/19/18 0000)  morphine 2 MG/ML injection 2 mg (has no administration in time range)  nitroGLYCERIN (NITROSTAT) SL tablet 0.4 mg (has no administration in time range)  losartan (COZAAR) tablet 100 mg (100 mg Oral Given 01/19/18 1000)  hydrALAZINE (APRESOLINE) injection 10 mg (has no administration in time range)  loratadine (CLARITIN) tablet 10 mg (10 mg Oral Given 01/19/18 1001)  albuterol (PROVENTIL) (2.5 MG/3ML) 0.083% nebulizer solution 2.5 mg (has no administration in time range)  cefTRIAXone (ROCEPHIN) 2 g in sodium chloride 0.9 % 100 mL IVPB (2 g Intravenous New Bag/Given 01/19/18 1002)     Initial Impression / Assessment and Plan / ED Course  I have reviewed the triage vital signs and the nursing notes.  Pertinent labs & imaging results that were available during my care of the patient were reviewed by me and considered in my medical decision making (see chart for details).     Patient is a 67yo female with PMHx of HTN, HLD, depression, and recent craniotomy for resection of benign meningioma on 12/05/17 who presents with CP upon waking this morning described as pressure.  Had relief with nitro.  Given full dose ASA PTA.  Currently weaning off keppra due to undesired side effects.  No SOB.  On arrival patient is HDS and well appearing.  Exam as above unremarkable.  EKG shows NSR with a rate of 72 and no evidence of acute ischemic changes, abnormal intervals, or dysrhythmia.  No significant change  was found.  Patient with HEAR  score of 5 and no recent cardiac stress testing.  Initial troponin negative however will need admission for ACS r/o.  D dimer negative therefore doubt PE.  Unlikely pneumonia, no cough, no leukocytosis, no fevers, CXR and exam without acute findings. Unlikely pneumothorax, no findings on CXR. Unlikely pericarditis/myocarditis, does not fit clinical picture, chest pain not exertional, no EKG findings to support.  Unlikely dissection as no ripping tearing back pain, no neck pain, no pulse deficit, and no concerning FHx.  Discussed case with hospitalist who will admit.    The plan for this patient was discussed with Dr. Tyrone Nine who voiced agreement and who oversaw evaluation and treatment of this patient.   Final Clinical Impressions(s) / ED Diagnoses   Final diagnoses:  Chest pain, unspecified type    ED Discharge Orders    None       Fabian November, MD 01/19/18 [place], Dan, DO 01/19/18 [place], DO 01/19/18 1520

## 2018-01-18 NOTE — Progress Notes (Signed)
Report attempted x 1. Secretary says to call the ED back.

## 2018-01-19 ENCOUNTER — Other Ambulatory Visit: Payer: Self-pay | Admitting: Cardiology

## 2018-01-19 DIAGNOSIS — N390 Urinary tract infection, site not specified: Secondary | ICD-10-CM

## 2018-01-19 DIAGNOSIS — R072 Precordial pain: Secondary | ICD-10-CM | POA: Diagnosis not present

## 2018-01-19 DIAGNOSIS — E7800 Pure hypercholesterolemia, unspecified: Secondary | ICD-10-CM

## 2018-01-19 DIAGNOSIS — R0600 Dyspnea, unspecified: Secondary | ICD-10-CM

## 2018-01-19 DIAGNOSIS — I10 Essential (primary) hypertension: Secondary | ICD-10-CM | POA: Diagnosis not present

## 2018-01-19 DIAGNOSIS — R079 Chest pain, unspecified: Secondary | ICD-10-CM

## 2018-01-19 DIAGNOSIS — R0789 Other chest pain: Secondary | ICD-10-CM | POA: Diagnosis not present

## 2018-01-19 LAB — LIPID PANEL
CHOL/HDL RATIO: 7.1 ratio
Cholesterol: 207 mg/dL — ABNORMAL HIGH (ref 0–200)
HDL: 29 mg/dL — ABNORMAL LOW (ref >40)
LDL CALC: 141 mg/dL — AB (ref 0–99)
TRIGLYCERIDES: 185 mg/dL — AB (ref <150)
VLDL: 37 mg/dL (ref 0–40)

## 2018-01-19 LAB — CBC WITH DIFFERENTIAL/PLATELET
ABS IMMATURE GRANULOCYTES: 0.04 10*3/uL (ref 0.00–0.07)
BASOS PCT: 0 %
Basophils Absolute: 0 10*3/uL (ref 0.0–0.1)
EOS ABS: 0.1 10*3/uL (ref 0.0–0.5)
Eosinophils Relative: 1 %
HCT: 43.2 % (ref 36.0–46.0)
Hemoglobin: 13.6 g/dL (ref 12.0–15.0)
Immature Granulocytes: 0 %
Lymphocytes Relative: 23 %
Lymphs Abs: 2 10*3/uL (ref 0.7–4.0)
MCH: 27.4 pg (ref 26.0–34.0)
MCHC: 31.5 g/dL (ref 30.0–36.0)
MCV: 86.9 fL (ref 80.0–100.0)
Monocytes Absolute: 0.6 10*3/uL (ref 0.1–1.0)
Monocytes Relative: 7 %
NEUTROS ABS: 6.2 10*3/uL (ref 1.7–7.7)
Neutrophils Relative %: 69 %
Platelets: 230 10*3/uL (ref 150–400)
RBC: 4.97 MIL/uL (ref 3.87–5.11)
RDW: 13.2 % (ref 11.5–15.5)
WBC: 8.9 10*3/uL (ref 4.0–10.5)
nRBC: 0 % (ref 0.0–0.2)

## 2018-01-19 LAB — BASIC METABOLIC PANEL
ANION GAP: 8 (ref 5–15)
BUN: 6 mg/dL — ABNORMAL LOW (ref 8–23)
CHLORIDE: 106 mmol/L (ref 98–111)
CO2: 26 mmol/L (ref 22–32)
Calcium: 9 mg/dL (ref 8.9–10.3)
Creatinine, Ser: 0.92 mg/dL (ref 0.44–1.00)
GFR calc non Af Amer: >60 mL/min (ref >60)
Glucose, Bld: 105 mg/dL — ABNORMAL HIGH (ref 70–99)
POTASSIUM: 4 mmol/L (ref 3.5–5.1)
Sodium: 140 mmol/L (ref 135–145)

## 2018-01-19 LAB — TROPONIN I
Troponin I: <0.03 ng/mL (ref <0.03)
Troponin I: <0.03 ng/mL (ref <0.03)

## 2018-01-19 LAB — HIV ANTIBODY (ROUTINE TESTING W REFLEX): HIV Screen 4th Generation wRfx: NONREACTIVE

## 2018-01-19 MED ORDER — SODIUM CHLORIDE 0.9 % IV SOLN
2.0000 g | INTRAVENOUS | Status: DC
  Administered 2018-01-19: 2 g via INTRAVENOUS
  Filled 2018-01-19 (×2): qty 20

## 2018-01-19 MED ORDER — CEPHALEXIN 500 MG PO CAPS: 500.0000 mg | ORAL_CAPSULE | Freq: Two times a day (BID) | ORAL | 0 refills | Status: DC

## 2018-01-19 MED ORDER — PANTOPRAZOLE SODIUM 40 MG PO TBEC: 40.0000 mg | DELAYED_RELEASE_TABLET | Freq: Every day | ORAL | Status: DC

## 2018-01-19 MED ORDER — CEPHALEXIN 500 MG PO CAPS: 500.0000 mg | ORAL_CAPSULE | Freq: Two times a day (BID) | ORAL | 0 refills | Status: AC

## 2018-01-19 MED ORDER — PANTOPRAZOLE SODIUM 40 MG PO TBEC: 40.0000 mg | DELAYED_RELEASE_TABLET | Freq: Every day | ORAL | 0 refills | Status: DC

## 2018-01-19 NOTE — Discharge Instructions (Signed)
Outpatient treadmill stress test - schedule once she has had a chance to recover from her neurosurgery. Strongly encourage weight loss and regular exercise. After 3-6 months of lifestyle changes, recheck lipid profile.  Start statin if LDL remains above 130.

## 2018-01-19 NOTE — Progress Notes (Addendum)
PROGRESS NOTE    Angelica Harvey  MAU:[id number] DOB: June 29, 1950 DOA: 01/18/2018 PCP: Ladell Pier, MD   Brief Narrative:   67 year old woman with a history of hypertension, hyperlipidemia, anxiety, depression, and recent craniotomy for resection of meningioma on 12/05/2017, on Keppra prophylaxis since, presenting with substernal chest pressure accompanied by shortness of breath, in the afternoon prior to admission.  She has similar symptoms several years ago, which were felt to be due reflux.  The time, she had a negative cardiac work-up.  No stress test was performed at the time.  This was preceded by episodes of anxiety, anger, diplopia and blurred vision, which she feels they are related to [place], and medicine that has been weaned off since.  No diaphoresis, wheezing, fever or chills, vomiting or diarrhea or recent sick contacts.  On transport by EMS, the patient received 1 nitroglycerin, and 324 mg of aspirin, with resolution of symptoms.   At the ER, she was noted to have very elevated blood pressures in the 563 systolic. home medications resumed, with good control, currently at 140/67, with normal pulse and saturation   Assessment & Plan:   Principal Problem:   Chest pain Active Problems:   Essential hypertension   Severe obesity (BMI >= 40) (HCC)   Status post craniotomy   Meningioma ([facility])   Dyspnea   Acute lower UTI   Chest pain syndrome, CE negative.  EKG with first-degree heart block.  - Heart score is 4.  D-dimer normal Cardiology consultation is pending, appreciate involvement Continue nitroglycerin, morphine and aspirin as needed for chest pain Protonix  40 mg daily  -? From elevated BP  Benign meningioma, status post craniotomy with resection 8/ 2019, Dr. Kathyrn Sheriff.   -has been weaned off keppra  UTI, acute.  The patient reports urinary frequency over the last 2 days, no dysuria.  UA positive for nitrites. Continue Rocephin IV Urine culture  pending  Hyperlipidemia, lipid panel remarkable for LDL of 141, triglycerides 185, HDL 29, cholesterol 207. Likely to need statins  Hypertension, currently is controlled Continue losartan  Morbid obesity, Body mass index is 41.52 kg/m.    DVT prophylaxis:  Lovenox Code Status: Full Code   Family Communication: None  Disposition Plan: Home when stable  Consultants:   Cardiology   Procedures:  None  Antimicrobials:   Rocephin IV    Subjective: anxious  Objective: Vitals:   01/18/18 2147 01/18/18 2212 01/19/18 0536 01/19/18 0923  BP: (!) 156/83 (!) 143/48 (!) 146/56 140/67  Pulse: 66 69 69 68  Resp: 16 19 19  (!) 21  Temp:  98.4 F (36.9 C) 98 F (36.7 C) 97.9 F (36.6 C)  TempSrc:  Oral Oral Oral  SpO2: 99% 99% 97% 99%  Weight:   103 kg   Height:        Intake/Output Summary (Last 24 hours) at 01/19/2018 1209 Last data filed at 01/19/2018 0900 Gross per 24 hour  Intake 480 ml  Output 1350 ml  Net -870 ml   Filed Weights   01/18/18 1747 01/19/18 0536  Weight: 103.1 kg 103 kg    Examination:  General exam: Appears anxious but comfortable  Respiratory system: Clear to auscultation. Respiratory effort normal. Cardiovascular system: S1 & S2 heard, RRR. No JVD, murmurs, rubs, gallops or clicks.  Patient ectopic beat.  No pedal edema.  Pain not reproducible with palpation. Gastrointestinal system: Abdomen is nondistended, soft and nontender. No organomegaly or masses felt. Normal bowel sounds heard. Central nervous system:  Alert and oriented. No focal neurological deficits.  Well-healed right craniotomy site. Extremities: Symmetric 5 x 5 power. Skin: No rashes, lesions or ulcers Psychiatry: Judgement and insight appear normal. Mood anxious & affect appropriate.     Data Reviewed: I have personally reviewed following labs and imaging studies  CBC: Recent Labs  Lab 01/18/18 1825 01/19/18 0326  WBC 9.8 8.9  NEUTROABS 7.0 6.2  HGB 13.7 13.6   HCT 42.8 43.2  MCV 86.8 86.9  PLT 223 594   Basic Metabolic Panel: Recent Labs  Lab 01/18/18 1825 01/19/18 0326  NA 138 140  K 3.5 4.0  CL 105 106  CO2 23 26  GLUCOSE 103* 105*  BUN 7* 6*  CREATININE 0.85 0.92  CALCIUM 9.2 9.0   GFR: Estimated Creatinine Clearance: 67.7 mL/min (by C-G formula based on SCr of 0.92 mg/dL). Liver Function Tests: No results for input(s): AST, ALT, ALKPHOS, BILITOT, PROT, ALBUMIN in the last 168 hours. No results for input(s): LIPASE, AMYLASE in the last 168 hours. No results for input(s): AMMONIA in the last 168 hours. Coagulation Profile: No results for input(s): INR, PROTIME in the last 168 hours. Cardiac Enzymes: Recent Labs  Lab 01/18/18 2141 01/19/18 0015 01/19/18 0326  TROPONINI <0.03 <0.03 <0.03   BNP (last 3 results) No results for input(s): PROBNP in the last 8760 hours. HbA1C: No results for input(s): HGBA1C in the last 72 hours. CBG: No results for input(s): GLUCAP in the last 168 hours. Lipid Profile: Recent Labs    01/19/18 0326  CHOL 207*  HDL 29*  LDLCALC 141*  TRIG 185*  CHOLHDL 7.1   Thyroid Function Tests: No results for input(s): TSH, T4TOTAL, FREET4, T3FREE, THYROIDAB in the last 72 hours. Anemia Panel: No results for input(s): VITAMINB12, FOLATE, FERRITIN, TIBC, IRON, RETICCTPCT in the last 72 hours. Sepsis Labs: No results for input(s): PROCALCITON, LATICACIDVEN in the last 168 hours.  No results found for this or any previous visit (from the past 240 hour(s)).       Radiology Studies: Dg Chest 2 View  Result Date: 01/18/2018 CLINICAL DATA:  Chest tightness and shortness of breath for several days EXAM: CHEST - 2 VIEW COMPARISON:  CT from 12/05/2017 FINDINGS: Cardiac shadow is within normal limits. The lungs are clear bilaterally. No focal infiltrate or effusion is seen. Degenerative changes of the thoracic spine are noted. IMPRESSION: No active cardiopulmonary disease. Electronically Signed   By:  Inez Catalina M.D.   On: 01/18/2018 18:31        Scheduled Meds: . enoxaparin (LOVENOX) injection  40 mg Subcutaneous Q24H  . loratadine  10 mg Oral Daily  . losartan  100 mg Oral Daily   Continuous Infusions: . cefTRIAXone (ROCEPHIN)  IV 2 g (01/19/18 1002)     LOS: 0 days        Eulogio Bear DO [facility]   If 7PM-7AM, please contact night-coverage [email or web address] Password [email or web address] 01/19/2018, 12:09 PM

## 2018-01-19 NOTE — Discharge Summary (Signed)
Physician Discharge Summary  Angelica Harvey UXN:[id number] DOB: 29-Jul-1950 DOA: 01/18/2018  PCP: Ladell Pier, MD  Admit date: 01/18/2018 Discharge date: 01/19/2018  Admitted From: Home  Disposition: Home Recommendations for Outpatient Follow-up:  1. Follow up with PCP this week with CBC and BMET and to follow cultures in the setting of Urinary tract infection  2. Follow with Cardiology, for possible Stress Test as Outpatient once recovered from surgery on brain  Home Health: No Equipment/Devices:None  Discharge Condition: Stable  CODE STATUS: FULL    Diet recommendation:   Heart Healthy Brief/Interim Summary:   67 year old woman with a history of hypertension, hyperlipidemia, anxiety, depression, and recent craniotomy for resection of meningioma on 12/05/2017, on Keppra prophylaxis since, presenting with substernal chest pressure accompanied by shortness of breath, in the afternoon prior to admission.  She has similar symptoms several years ago, which were felt to be due reflux. At the time, she had a negative cardiac work-up.  No stress test was performed at the time.  Admission symptoms were preceded by episodes of anxiety, anger, diplopia and blurred vision, which she feels they are related to [place], and medicine that has been weaned off since.  No diaphoresis, wheezing, fever or chills, vomiting or diarrhea or recent sick contacts.  On transport by EMS, the patient received 1 nitroglycerin, and 324 mg of aspirin without further CP.   At the ER, she was noted to have very elevated blood pressures in the 254 systolic. home medications resumed, with good control, currently at 140/67, with normal pulse and saturation EKG showed NSR with 1st Degree AVB. Tn neg x3 . CXR normal.    Discharge Diagnoses:  Principal Problem:   Chest pain Active Problems:   Essential hypertension   Severe obesity (BMI >= 40) (HCC)   Status post craniotomy   Meningioma (HCC)   Dyspnea   Acute lower  UTI   Chest pain syndrome,  likely due to Hypertension, Anxiety.  Cardiac exam negative.  EKG with first-degree heart block. No further events during hospitalization   Heart score is 4.  D-dimer normal Cardiology consultation obtained,  to follow up as OP with Stress Test once recovered from neurosurgery intervention . Appreciate involvement Prescription for  Protonix 40 mg daily for GI symptoms written Counseled patient to attend Yoga versus meditation to improve breathing, to lower anxiety level    Benign meningioma, status post craniotomy with resection 8/ 2019, Dr. Kathyrn Sheriff.   has been weaned off keppra  UTI, acute.  The patient reports urinary frequency over the last 2 days, no dysuria.  UA positive for nitrites. Received Rocephin  Urine culture pending follow as outpatient Keflex 500 mg bid for total  3 day therapy   Hyperlipidemia, lipid panel remarkable for LDL of 141, triglycerides 185, HDL 29, cholesterol 207. Will likel need to start on Statins by PCP if lifestyle modification does not work   Hypertension,-resume home regimen -adjust as needed    Morbid obesity, Body mass index is 41.52 kg/m. Lost 20 lbs "thanks to [place]" Lifestyle modification counseled, patient agreed to start a weight loss program, may benefit from Weight Watchers, or the like.  Discharge Instructions  Discharge Instructions    Diet - low sodium heart healthy   Complete by:  As directed    Discharge instructions   Complete by:  As directed    Follow up with your primary doctor within 1 week for post hospital visit with labs, including CBC, BMET and follow urine cultures  Follow with Cardiology as outpatient, Cardiology to set  Up appointment   Increase activity slowly   Complete by:  As directed      Allergies as of 01/19/2018      Reactions   Prozac [fluoxetine] Other (See Comments)   Induced "Serotonin Syndrome"   Belladonna Other (See Comments)   Blurred vision/burning   Hctz  [hydrochlorothiazide] Other (See Comments)   Abdominal pain and DEPLETED THE POTASSIUM FROM HER BODY; HAD TO GO TO [place] [levetiracetam] Anxiety, Other (See Comments), Hypertension   Depression, rage, vision is blurry, and sees "double" images   Phenobarbital Rash, Other (See Comments)   Rash on hands and feet, and water retention   Quinine Rash      Medication List    STOP taking these medications   levETIRAcetam 500 MG tablet Commonly known as:  KEPPRA     TAKE these medications   CENTRUM SILVER 50+WOMEN Tabs Take 1 tablet by mouth daily.   cephALEXin 500 MG capsule Commonly known as:  KEFLEX Take 1 capsule (500 mg total) by mouth 2 (two) times daily for 3 days.   losartan 100 MG tablet Commonly known as:  COZAAR Take 1 tablet (100 mg total) by mouth daily.   pantoprazole 40 MG tablet Commonly known as:  PROTONIX Take 1 tablet (40 mg total) by mouth daily at 6 (six) AM. Start taking on:  01/20/2018   PROBIOTIC PO Take 1 capsule by mouth daily.      Follow-up Information    Ladell Pier, MD. Schedule an appointment as soon as possible for a visit in 1 week.   Specialty:  Internal Medicine Why:  follow up after hospitalization with BMET, CBC and follow urine cultures (diagnosis of UTI, dc on Keflex)  Contact information: [place] [address] [phone]          Allergies  Allergen Reactions  . Prozac [Fluoxetine] Other (See Comments)    Induced "Serotonin Syndrome"  . Belladonna Other (See Comments)    Blurred vision/burning  . Hctz [Hydrochlorothiazide] Other (See Comments)    Abdominal pain and DEPLETED THE POTASSIUM FROM HER BODY; HAD TO GO TO [place]  . Keppra [Levetiracetam] Anxiety, Other (See Comments) and Hypertension    Depression, rage, vision is blurry, and sees "double" images  . Phenobarbital Rash and Other (See Comments)    Rash on hands and feet, and water retention  . Quinine Rash     Consultations: Cardiology   Procedures/Studies: Dg Chest 2 View  Result Date: 01/18/2018 CLINICAL DATA:  Chest tightness and shortness of breath for several days EXAM: CHEST - 2 VIEW COMPARISON:  CT from 12/05/2017 FINDINGS: Cardiac shadow is within normal limits. The lungs are clear bilaterally. No focal infiltrate or effusion is seen. Degenerative changes of the thoracic spine are noted. IMPRESSION: No active cardiopulmonary disease. Electronically Signed   By: Inez Catalina M.D.   On: 01/18/2018 18:31      Subjective:Denies Chest pain, nausea, diaphoresis, neck pain, headache or vision changes. Denies shortness of breath or cough. Denies abdominal pain or dysuria. No gross hematuria. Denies calf pain or lower extremity swelling . No unilateral weakness, numbness or tingling. No confusion or seizures reported.   Discharge Exam: Vitals:   01/19/18 0923 01/19/18 1200  BP: 140/67 139/80  Pulse: 68 74  Resp: (!) 21 16  Temp: 97.9 F (36.6 C) 98.2 F (36.8 C)  SpO2: 99% 98%   Vitals:  01/18/18 2212 01/19/18 0536 01/19/18 0923 01/19/18 1200  BP: (!) 143/48 (!) 146/56 140/67 139/80  Pulse: 69 69 68 74  Resp: 19 19 (!) 21 16  Temp: 98.4 F (36.9 C) 98 F (36.7 C) 97.9 F (36.6 C) 98.2 F (36.8 C)  TempSrc: Oral Oral Oral Oral  SpO2: 99% 97% 99% 98%  Weight:  103 kg    Height:        General: Pt is alert, awake, not in acute distress. Very anxious appearing  Cardiovascular: RRR, S1/S2 +, no rubs, no gallops Respiratory: CTA bilaterally, no wheezing, no rhonchi Abdominal: Soft, NT, ND, bowel sounds + Extremities: no edema, no cyanosis    The results of significant diagnostics from this hospitalization (including imaging, microbiology, ancillary and laboratory) are listed below for reference.     Microbiology: No results found for this or any previous visit (from the past 240 hour(s)).   Labs: BNP (last 3 results) Recent Labs    01/18/18 1747  BNP 62.2    Basic Metabolic Panel: Recent Labs  Lab 01/18/18 1825 01/19/18 0326  NA 138 140  K 3.5 4.0  CL 105 106  CO2 23 26  GLUCOSE 103* 105*  BUN 7* 6*  CREATININE 0.85 0.92  CALCIUM 9.2 9.0   Liver Function Tests: No results for input(s): AST, ALT, ALKPHOS, BILITOT, PROT, ALBUMIN in the last 168 hours. No results for input(s): LIPASE, AMYLASE in the last 168 hours. No results for input(s): AMMONIA in the last 168 hours. CBC: Recent Labs  Lab 01/18/18 1825 01/19/18 0326  WBC 9.8 8.9  NEUTROABS 7.0 6.2  HGB 13.7 13.6  HCT 42.8 43.2  MCV 86.8 86.9  PLT 223 230   Cardiac Enzymes: Recent Labs  Lab 01/18/18 2141 01/19/18 0015 01/19/18 0326  TROPONINI <0.03 <0.03 <0.03   BNP: Invalid input(s): POCBNP CBG: No results for input(s): GLUCAP in the last 168 hours. D-Dimer Recent Labs    01/18/18 1825  DDIMER 0.38   Hgb A1c No results for input(s): HGBA1C in the last 72 hours. Lipid Profile Recent Labs    01/19/18 0326  CHOL 207*  HDL 29*  LDLCALC 141*  TRIG 185*  CHOLHDL 7.1   Thyroid function studies No results for input(s): TSH, T4TOTAL, T3FREE, THYROIDAB in the last 72 hours.  Invalid input(s): FREET3 Anemia work up No results for input(s): VITAMINB12, FOLATE, FERRITIN, TIBC, IRON, RETICCTPCT in the last 72 hours. Urinalysis    Component Value Date/Time   COLORURINE YELLOW 01/18/2018 2216   APPEARANCEUR CLEAR 01/18/2018 2216   LABSPEC 1.010 01/18/2018 2216   PHURINE 6.0 01/18/2018 2216   GLUCOSEU NEGATIVE 01/18/2018 2216   HGBUR SMALL (A) 01/18/2018 2216   BILIRUBINUR NEGATIVE 01/18/2018 2216   KETONESUR 5 (A) 01/18/2018 2216   PROTEINUR NEGATIVE 01/18/2018 2216   UROBILINOGEN 0.2 02/15/2009 1609   NITRITE POSITIVE (A) 01/18/2018 2216   LEUKOCYTESUR MODERATE (A) 01/18/2018 2216   Sepsis Labs Invalid input(s): PROCALCITONIN,  WBC,  LACTICIDVEN Microbiology No results found for this or any previous visit (from the past 240 hour(s)).   Time  coordinating discharge: Over 30 minutes  SIGNED:   Geradine Girt, DO [facility] 01/19/2018, 4:40 PM   If 7PM-7AM, please contact night-coverage [email or web address] Password [email or web address]

## 2018-01-19 NOTE — Progress Notes (Signed)
Pt has orders to be discharged. Discharge instructions given and pt has no additional questions at this time. Medication regimen reviewed and pt educated. Pt verbalized understanding and has no additional questions. Telemetry box removed. IV removed and site in good condition. Pt stable and waiting for transportation. 

## 2018-01-19 NOTE — Consult Note (Addendum)
Cardiology Consultation:   Patient ID: Angelica Harvey; [id number]; 1951-01-07   Admit date: 01/18/2018 Date of Consult: 01/19/2018  Primary Care Provider: Ladell Pier, MD Primary Cardiologist: New to [facility]  Patient Profile:   Angelica Harvey is a 67 y.o. female with a hx of HTN, HLD, anxiety, depression and recent craniotomy for resection of extra-axial tumor on 8/26 who is being seen today for the evaluation of chest pressure at the request of Dr. Tamala Julian.   History of Present Illness:   Angelica Harvey is a 67yo female with a hx as stated above who presented to [facility] on 01/18/18 with complaints of chest pressure and shortness of breath which began on the morning of admission. She reports that her symptoms initially began following her recent begnin brain tumor removal  where she was started on prophylactic doses of Keppra. Several weeks after initation, she reported feeling more anxious with increased anger and blurred vision. She was seen by her neurologist and it was recommended that she taper off of the medication given that she was approximately 4 weeks out from surgery. She was told to take the [place] every other day for several days then stop. However, she reports more recently that her BP has been more elevated than normal and that she has just been feeling "poorly". Given this, she called the fire department on Tuesday evening to come and check her pressure. This was found to be in the more elevated than normal with SBP's in the 200's. She was instructed to go to the ED however refused. Yesterday, she reports that she woke up and began having episodes of left-sided chest pressure which was waxing and waning over most of the morning. She reports mild SOB, but no radiaiton or N/V. She tried taking a few deep breaths without improvement. Given that her symptoms persisted, she proceeded to the ED for further evaluation. Of note, she was remotely seen by our service from 01/11/2013 for the  evaluation of atypical chest pain more thought to be more consistent with GERD rather than ACS. She was given a trail of anti-reflux therapy with good response and therefore no further cardiac workup was performed. She has not had issues since this time. She currently denies chest pain, palpitations, fever, chills, N/V, dizziness, presyncopal or syncopal episodes. She states that she feels much better after admission. She reports that she does not use tobacco, alcohol or illicit drugs. She had a family hx in her father of CAD with CABG in his 77's.   In the ED, vitals were found to be stable. Troponin was found to be negative x3 at <0.03, <0.03, <0.03. EKG with NSR and 1st degree AV block. There is evidence of early repolarization and possible ST depression in V5-V6, however this is similar to prior tracings. CXR with no active cardiopulmonary disease. D-dimer was assessed which was negative at 0.38.   Past Medical History:  Diagnosis Date  . Anxiety   . Cataract    surgery - bilateral  . Colon polyps   . Depression   . History of hypokalemia   . Hypercholesterolemia    diet controlled - no meds  . Hypertension   . Obesity   . Pneumonia     Past Surgical History:  Procedure Laterality Date  . APPLICATION OF CRANIAL NAVIGATION N/A 12/02/2017   Procedure: APPLICATION OF CRANIAL NAVIGATION;  Surgeon: Consuella Lose, MD;  Location: [facility];  Service: Neurosurgery;  Laterality: N/A;  . cataract surgery Bilateral   .  CESAREAN SECTION     x 1  . COLONOSCOPY  08/2005   Olevia Perches- hx polyps  . CRANIOTOMY Right 12/02/2017   Procedure: STEREOTACTIC RIGHT PTERIONAL CRANIOTOMY FOR RESECTION OF MENINGIOMA;  Surgeon: Consuella Lose, MD;  Location: [place];  Service: Neurosurgery;  Laterality: Right;  . EYE SURGERY Bilateral    Cataract surgery      Prior to Admission medications   Medication Sig Start Date End Date Taking? Authorizing Provider  levETIRAcetam (KEPPRA) 500 MG tablet Take 1 tablet  (500 mg total) by mouth 2 (two) times daily. Patient taking differently: Take 500 mg by mouth every other day.  12/05/17  Yes Costella, Vista Mink, PA-C  losartan (COZAAR) 100 MG tablet Take 1 tablet (100 mg total) by mouth daily. 11/18/17  Yes Ladell Pier, MD  Multiple Vitamins-Minerals (CENTRUM SILVER 50+WOMEN) TABS Take 1 tablet by mouth daily.   Yes [provider]  Probiotic Product (PROBIOTIC PO) Take 1 capsule by mouth daily.    Yes [provider]    Inpatient Medications: Scheduled Meds: . enoxaparin (LOVENOX) injection  40 mg Subcutaneous Q24H  . loratadine  10 mg Oral Daily  . losartan  100 mg Oral Daily  . [START ON 01/20/2018] pantoprazole  40 mg Oral Q0600   Continuous Infusions: . cefTRIAXone (ROCEPHIN)  IV 2 g (01/19/18 1002)   PRN Meds: acetaminophen, albuterol, hydrALAZINE, morphine injection, nitroGLYCERIN, ondansetron (ZOFRAN) IV  Allergies:    Allergies  Allergen Reactions  . Prozac [Fluoxetine] Other (See Comments)    Induced "Serotonin Syndrome"  . Belladonna Other (See Comments)    Blurred vision/burning  . Hctz [Hydrochlorothiazide] Other (See Comments)    Abdominal pain and DEPLETED THE POTASSIUM FROM HER BODY; HAD TO GO TO [place]  . Keppra [Levetiracetam] Anxiety, Other (See Comments) and Hypertension    Depression, rage, vision is blurry, and sees "double" images  . Phenobarbital Rash and Other (See Comments)    Rash on hands and feet, and water retention  . Quinine Rash    Social History:   Social History   Socioeconomic History  . Marital status: Married    Spouse name: Not on file  . Number of children: 1  . Years of education: Not on file  . Highest education level: Not on file  Occupational History  . Occupation: retired  Scientific laboratory technician  . Financial resource strain: Not hard at all  . Food insecurity:    Worry: Never true    Inability: Never true  . Transportation needs:    Medical: No    Non-medical: No    Tobacco Use  . Smoking status: Former Smoker    Packs/day: 1.00    Years: 17.00    Pack years: 17.00    Types: Cigarettes    Last attempt to quit: 04/12/1990    Years since quitting: 27.7  . Smokeless tobacco: Never Used  Substance and Sexual Activity  . Alcohol use: Yes    Comment: ocassional wine   . Drug use: No  . Sexual activity: Not on file  Lifestyle  . Physical activity:    Days per week: Patient refused    Minutes per session: Patient refused  . Stress: Rather much  Relationships  . Social connections:    Talks on phone: Patient refused    Gets together: Patient refused    Attends religious service: Patient refused    Active member of club or organization: Patient refused    Attends meetings of  clubs or organizations: Patient refused    Relationship status: Married  . Intimate partner violence:    Fear of current or ex partner: No    Emotionally abused: No    Physically abused: No    Forced sexual activity: No  Other Topics Concern  . Not on file  Social History Narrative  . Not on file    Family History:   Family History  Problem Relation Age of Onset  . Heart attack Father   . Heart disease Father        CABG  . Colon polyps Mother   . Kidney disease Sister        dialysis  . Colon cancer Maternal Aunt        x 2  . Esophageal cancer Neg Hx   . Stomach cancer Neg Hx   . Rectal cancer Neg Hx    Family Status:  Family Status  Relation Name Status  . Father  Deceased  . Mother  Alive  . Sister  Deceased  . Sister  Alive  . Sister  Alive  . Mat Aunt  (Not Specified)  . Neg Hx  (Not Specified)    ROS:  Please see the history of present illness.  All other ROS reviewed and negative.     Physical Exam/Data:   Vitals:   01/18/18 2212 01/19/18 0536 01/19/18 0923 01/19/18 1200  BP: (!) 143/48 (!) 146/56 140/67 139/80  Pulse: 69 69 68 74  Resp: 19 19 (!) 21 16  Temp: 98.4 F (36.9 C) 98 F (36.7 C) 97.9 F (36.6 C) 98.2 F (36.8 C)   TempSrc: Oral Oral Oral Oral  SpO2: 99% 97% 99% 98%  Weight:  103 kg    Height:        Intake/Output Summary (Last 24 hours) at 01/19/2018 1408 Last data filed at 01/19/2018 1300 Gross per 24 hour  Intake 480 ml  Output 1350 ml  Net -870 ml   Filed Weights   01/18/18 1747 01/19/18 0536  Weight: 103.1 kg 103 kg   Body mass index is 41.52 kg/m.   General: Well developed, well nourished, NAD Skin: Warm, dry, intact  Head: Normocephalic, atraumatic, clear, moist mucus membranes. Neck: Negative for carotid bruits. No JVD Lungs:Clear to ausculation bilaterally. No wheezes, rales, or rhonchi. Breathing is unlabored. Cardiovascular: RRR with S1 S2. No murmurs, rubs, gallops, or LV heave appreciated. Abdomen: Soft, non-tender, non-distended with normoactive bowel sounds. No obvious abdominal masses. MSK: Strength and tone appear normal for age. 5/5 in all extremities Extremities: No edema. No clubbing or cyanosis. DP/PT pulses 2+ bilaterally Neuro: Alert and oriented. No focal deficits. No facial asymmetry. MAE spontaneously. Psych: Responds to questions appropriately with normal affect.    EKG:  The EKG was personally reviewed and demonstrates: 01/19/18 NSR with 1st degree AV block. Evidence of early repolarization and ST depression in V5-V6 however similar to prior tracings  Telemetry:  Telemetry was personally reviewed and demonstrates: 01/19/18 NSR   Relevant CV Studies:  ECHO: None   CATH: None   Laboratory Data:  Chemistry Recent Labs  Lab 01/18/18 1825 01/19/18 0326  NA 138 140  K 3.5 4.0  CL 105 106  CO2 23 26  GLUCOSE 103* 105*  BUN 7* 6*  CREATININE 0.85 0.92  CALCIUM 9.2 9.0  GFRNONAA >60 >60  GFRAA >60 >60  ANIONGAP 10 8    Total Protein  Date Value Ref Range Status  03/07/2017 6.7 6.0 - 8.5  g/dL Final   Albumin  Date Value Ref Range Status  03/07/2017 4.4 3.6 - 4.8 g/dL Final   AST  Date Value Ref Range Status  03/07/2017 24 0 - 40 IU/L  Final   ALT  Date Value Ref Range Status  03/07/2017 27 0 - 32 IU/L Final   Alkaline Phosphatase  Date Value Ref Range Status  03/07/2017 103 39 - 117 IU/L Final   Bilirubin Total  Date Value Ref Range Status  03/07/2017 0.3 0.0 - 1.2 mg/dL Final   Hematology Recent Labs  Lab 01/18/18 1825 01/19/18 0326  WBC 9.8 8.9  RBC 4.93 4.97  HGB 13.7 13.6  HCT 42.8 43.2  MCV 86.8 86.9  MCH 27.8 27.4  MCHC 32.0 31.5  RDW 13.1 13.2  PLT 223 230   Cardiac Enzymes Recent Labs  Lab 01/18/18 2141 01/19/18 0015 01/19/18 0326  TROPONINI <0.03 <0.03 <0.03    Recent Labs  Lab 01/18/18 1832  TROPIPOC 0.00    BNP Recent Labs  Lab 01/18/18 1747  BNP 48.1    DDimer  Recent Labs  Lab 01/18/18 1825  DDIMER 0.38   TSH:  Lab Results  Component Value Date   TSH 1.655 05/15/2012   Lipids: Lab Results  Component Value Date   CHOL 207 (H) 01/19/2018   HDL 29 (L) 01/19/2018   LDLCALC 141 (H) 01/19/2018   TRIG 185 (H) 01/19/2018   CHOLHDL 7.1 01/19/2018   HgbA1c: Lab Results  Component Value Date   HGBA1C 5.80 05/23/2015    Radiology/Studies:  Dg Chest 2 View  Result Date: 01/18/2018 CLINICAL DATA:  Chest tightness and shortness of breath for several days EXAM: CHEST - 2 VIEW COMPARISON:  CT from 12/05/2017 FINDINGS: Cardiac shadow is within normal limits. The lungs are clear bilaterally. No focal infiltrate or effusion is seen. Degenerative changes of the thoracic spine are noted. IMPRESSION: No active cardiopulmonary disease. Electronically Signed   By: Inez Catalina M.D.   On: 01/18/2018 18:31   Assessment and Plan:   1.Chest pressure without ACS symptoms: -Pt presented to [facility] on 01/18/18 with complaints of chest pressure and mild SOB which began yesterday AM while at rest. She began having more health concerns after being placed on Keppra post benign brain tumor removal approximately 6 weeks ago. She reports that she did not tolerate the medication well and had side  effects which include increased anxiety, panic attacks and blurred vision. She was in the process of tapering off the medication when she was concerned about her BP being too elevated, found to be in the 200's per patient report. Given that her symptoms did not dissipate, she proceeded for further evaluation -EKG with NSR with 1st degree AV block. Evidence of early repolarization and ST depression in V5-V6 however similar to prior tracings  -Trop, negative x3, <0.03, <0.03, <0.03 -CXR with no acute abnormalities -Denies recurrent chest pressure now that BP more stable>>139/80>140/67>146/56>143/48  -Likely chest pressure secondary to elevated BP and not in the setting of ACS.  -We will set her up for an OP exercise stress test and follow up after that . If test is negative, will cancel follow up appointment with cardiology. -Consider adding additional antihypertensive agent if BP remains elevated  2. Benign meningioma status post craniotomy with resection: -s/p resection 12/02/17 per Dr. Carroll Kinds -Placed on prophylactic Keppra however weaning secondary to side effects which include blurred vision, anxiety, anger and fatigue -More stable now   3. Essential HTN: -Elevated, 139/80>140/67>143/56 -  Continue losartan, consider adding additional agent if BP remains mildly elevated -Likely chest pressure in the setting of markedly elevated BP on presentation   4. Morbid obesity: -BMI, 41.57 -Encouraged weight loss for risk reduction  -Reports a 20lb weight loss since recent surgery   For questions or updates, please contact [employer] Please consult [email or web address] for contact info under Cardiology/STEMI.   SignedKathyrn Drown NP-C [facility] Pager: [phone] 01/19/2018 2:08 PM  I have seen and examined the patient along with Kathyrn Drown NP-C.  I have reviewed the chart, notes and new data.  I agree with PA/NP's note.  Key new complaints: Symptoms are atypical and there is a lot of  intersection with anxiety and depression.  She feels much better after normalization of her blood pressure. Key examination changes: Morbid obesity, otherwise normal cardiovascular exam Key new findings / data: ECG and cardiac enzymes are low risk.  LDL is elevated at > 140.  PLAN: Doubt acute coronary insufficiency. Continue current medical regimen for maintenance of normal blood pressure. Outpatient treadmill stress test - schedule once she has had a chance to recover from her neurosurgery. Strongly encourage weight loss and regular exercise. After 3-6 months of lifestyle changes, recheck lipid profile.  Start statin if LDL remains above 130.  Sanda Klein, MD, [place] [phone] 01/19/2018, 4:27 PM

## 2018-01-21 LAB — URINE CULTURE

## 2018-01-23 ENCOUNTER — Telehealth: Payer: Self-pay | Admitting: Family Medicine

## 2018-01-23 ENCOUNTER — Other Ambulatory Visit: Payer: Self-pay | Admitting: Internal Medicine

## 2018-01-23 ENCOUNTER — Encounter: Payer: Self-pay | Admitting: Family Medicine

## 2018-01-23 ENCOUNTER — Ambulatory Visit (INDEPENDENT_AMBULATORY_CARE_PROVIDER_SITE_OTHER): Payer: Medicare Other | Admitting: Family Medicine

## 2018-01-23 VITALS — BP 148/82 | HR 82 | Temp 98.3°F | Resp 17 | Ht 62.0 in | Wt 224.0 lb

## 2018-01-23 DIAGNOSIS — R072 Precordial pain: Secondary | ICD-10-CM | POA: Diagnosis not present

## 2018-01-23 DIAGNOSIS — Z9889 Other specified postprocedural states: Secondary | ICD-10-CM | POA: Diagnosis not present

## 2018-01-23 DIAGNOSIS — I10 Essential (primary) hypertension: Secondary | ICD-10-CM

## 2018-01-23 DIAGNOSIS — N390 Urinary tract infection, site not specified: Secondary | ICD-10-CM | POA: Diagnosis not present

## 2018-01-23 MED ORDER — CIPROFLOXACIN HCL 500 MG PO TABS: 500.0000 mg | ORAL_TABLET | Freq: Two times a day (BID) | ORAL | 0 refills | Status: AC

## 2018-01-23 NOTE — Progress Notes (Signed)
Angelica Harvey, is a 67 y.o. female  ERD:[id number]  HUD:[id number]  DOB - [birth year]  CC:  Chief Complaint  Patient presents with  . Hospitalization Follow-up    ED->Hosp 10/9-10/10 for unspecified chest pain. states that the chest pain has resolved       HPI: Angelica Harvey is a 67 y.o. female is here today to establish care.   Chronic health problems include:has Essential hypertension; Dyslipidemia; Depression; GERD (gastroesophageal reflux disease); Severe obesity (BMI >= 40) ([place]); Insomnia; Plantar fasciitis of left foot; Status post craniotomy; Meningioma ([place]); Chest pain; Dyspnea; and Acute lower UTI on their problem list.   Hospital follow-up Patient was admitted to the [facility] 01/18/2018 with a complaint of chest pressure and SOB. She received nitroglycerin and aspirin during transport via EMS which completely resolved chest pain. EKG was significant for NSR with 1st Degree AVB, negative troponin x 3, and CXR normal. Blood pressure was elevated during admission, and resolved with resumption of home blood pressure medications. She is s/p craniotomy with resection of meningioma. Discharge recommendations that patient be referred to cardiology for a stress test once she has recovered from recent surgery. She has remained free of Chest Pain since arriving at ER. During her hospitalization she was found to have UTI. Urine culture was E-Coli. She was prescribed oral Keflex to continue at home. Today she reports never receiving medication as her pharmacy is mailing prescription. Denies symptoms of frequency, dysuria, odor, or hematuria. Angelica Harvey is s/p craniotomy which was performed 12/02/17 and has not followed up with the neurosurgeon. "I didn't;t know I was suppose to". Follow-up instructions were noted on patient's discharged instruction from 12/02/17 admission. Denies dizziness, headaches, visual disturbances, nausea, gait instability, hallucinations, sleep disturbances, or seizure activity.   Current  medications: Current Outpatient Medications:  .  losartan (COZAAR) 100 MG tablet, Take 1 tablet (100 mg total) by mouth daily., Disp: 90 tablet, Rfl: 1 .  Multiple Vitamins-Minerals (CENTRUM SILVER 50+WOMEN) TABS, Take 1 tablet by mouth daily., Disp: , Rfl:  .  pantoprazole (PROTONIX) 40 MG tablet, Take 1 tablet (40 mg total) by mouth daily at 6 (six) AM., Disp: 30 tablet, Rfl: 0 .  Probiotic Product (PROBIOTIC PO), Take 1 capsule by mouth daily. , Disp: , Rfl:    Pertinent family medical history: family history includes Colon cancer in her maternal aunt; Colon polyps in her mother; Heart attack in her father; Heart disease in her father; Kidney disease in her sister.   Allergies  Allergen Reactions  . Prozac [Fluoxetine] Other (See Comments)    Induced "Serotonin Syndrome"  . Belladonna Other (See Comments)    Blurred vision/burning  . Hctz [Hydrochlorothiazide] Other (See Comments)    Abdominal pain and DEPLETED THE POTASSIUM FROM HER BODY; HAD TO GO TO [place]  . Keppra [Levetiracetam] Anxiety, Other (See Comments) and Hypertension    Depression, rage, vision is blurry, and sees "double" images  . Phenobarbital Rash and Other (See Comments)    Rash on hands and feet, and water retention  . Quinine Rash    Social History   Socioeconomic History  . Marital status: Married    Spouse name: Not on file  . Number of children: 1  . Years of education: Not on file  . Highest education level: Not on file  Occupational History  . Occupation: retired  Scientific laboratory technician  . Financial resource strain: Not hard at all  . Food insecurity:    Worry: Never true    Inability:  Never true  . Transportation needs:    Medical: No    Non-medical: No  Tobacco Use  . Smoking status: Former Smoker    Packs/day: 1.00    Years: 17.00    Pack years: 17.00    Types: Cigarettes    Last attempt to quit: 04/12/1990    Years since quitting: 27.8  . Smokeless tobacco: Never Used  Substance and Sexual  Activity  . Alcohol use: Yes    Comment: ocassional wine   . Drug use: No  . Sexual activity: Not on file  Lifestyle  . Physical activity:    Days per week: Patient refused    Minutes per session: Patient refused  . Stress: Rather much  Relationships  . Social connections:    Talks on phone: Patient refused    Gets together: Patient refused    Attends religious service: Patient refused    Active member of club or organization: Patient refused    Attends meetings of clubs or organizations: Patient refused    Relationship status: Married  . Intimate partner violence:    Fear of current or ex partner: No    Emotionally abused: No    Physically abused: No    Forced sexual activity: No  Other Topics Concern  . Not on file  Social History Narrative  . Not on file    Review of Systems: Constitutional: Negative for fever, chills, diaphoresis, activity change, appetite change and fatigue. HENT: Negative for ear pain, nosebleeds, congestion, facial swelling, rhinorrhea, neck pain, neck stiffness and ear discharge.  Eyes: Negative for pain, discharge, redness, itching and visual disturbance. Respiratory: Negative for cough, choking, chest tightness, shortness of breath, wheezing and stridor.  Cardiovascular: Negative for chest pain, palpitations and leg swelling. Gastrointestinal: Negative for abdominal distention, pain, or tenderness. Genitourinary: Negative for dysuria, urgency, frequency, hematuria, flank pain, decreased urine volume. Neurological: Negative for dizziness, tremors, seizures, syncope, facial asymmetry, speech difficulty, weakness, light-headedness, numbness and headaches.  Hematological: Negative for adenopathy. Does not bruise/bleed easily. Psychiatric/Behavioral: Negative for hallucinations, behavioral problems, suicidal ideations, homicidal ideations, confusion, dysphoric mood, decreased concentration and agitation.    Objective:   Vitals:   01/23/18 1343  BP:  (!) 148/82  Pulse: 82  Resp: 17  Temp: 98.3 F (36.8 C)  SpO2: 98%    BP Readings from Last 3 Encounters:  01/19/18 122/67  01/16/18 (!) 148/82  12/05/17 (!) 149/60    Filed Weights   01/23/18 1343  Weight: 224 lb (101.6 kg)      Physical Exam: Constitutional: Patient appears well-developed and well-nourished. No distress. HENT: Normocephalic, atraumatic, External right and left ear normal.  Eyes: Conjunctivae and EOM are normal. PERRLA, no scleral icterus. Neck: Normal ROM. Neck supple. No JVD. No tracheal deviation. No thyromegaly. CVS: RRR, S1/S2 +, no murmurs, no gallops, no carotid bruit.  Pulmonary: Effort and breath sounds normal, no stridor, rhonchi, wheezes, rales.  Abdominal: Soft. BS +, no distension, tenderness, rebound or guarding.  Musculoskeletal: Normal range of motion. No edema and no tenderness.  Neuro: Alert. Normal muscle tone coordination. No cranial nerve deficit.  Strength BUE and BLE 5/5. Symmetric hand grips. Skin: Skin is warm and dry. No rash noted. Not diaphoretic. No erythema. No pallor. Psychiatric: Normal mood and affect. Behavior, judgment, thought content normal.  Lab Results  Component Value Date   WBC 8.9 01/19/2018   HGB 13.6 01/19/2018   HCT 43.2 01/19/2018   MCV 86.9 01/19/2018   PLT 230 01/19/2018  Lab Results  Component Value Date   CREATININE 0.92 01/19/2018   BUN 6 (L) 01/19/2018   NA 140 01/19/2018   K 4.0 01/19/2018   CL 106 01/19/2018   CO2 26 01/19/2018    Lab Results  Component Value Date   HGBA1C 5.80 05/23/2015    Lipid Panel     Component Value Date/Time   CHOL 207 (H) 01/19/2018 0326   CHOL 218 (H) 03/07/2017 1618   TRIG 185 (H) 01/19/2018 0326   HDL 29 (L) 01/19/2018 0326   HDL 38 (L) 03/07/2017 1618   CHOLHDL 7.1 01/19/2018 0326   VLDL 37 01/19/2018 0326   LDLCALC 141 (H) 01/19/2018 0326   LDLCALC 142 (H) 03/07/2017 1618        Assessment and plan:  1. Essential hypertension,   Uncontrolled We have discussed target BP range and blood pressure goal. I have advised patient to check BP regularly and to call us back or report to clinic if the numbers are consistently higher than 140/90. We discussed the importance of compliance with medical therapy and DASH diet recommended, consequences of uncontrolled hypertension discussed. Continue current BP medications and take daily - Comprehensive metabolic panel; Future  2. Status post craniotomy, follow-up with neurosurgery. Neurological exam unremarkable today.  3. Acute lower UTI,  -Repeating urine culture, given patient hasn't completed oral antibiotics. - Checking CBC with Differential - Hemoglobin A1c,   4. Precordial pain resolved -EKG did reveal a 1st degree AV Block in the presence of CP. Pt agrees with a plan to follow-up with cardiology if chest pain reoccurs for a stress test and full cardiology work-up   Return in 6 weeks hypertension follow-up. Bring home BP cuff with you to visit. Go to [facility] to have labs drawn. I will follow-up with you via phone regarding results.   The patient was given clear instructions to go to ER or return to medical center if symptoms don't improve, worsen or new problems develop. The patient verbalized understanding. The patient was told to call to get lab results if they haven't heard anything in the next week.    Molli Barrows, FNP Primary Care at [facility] [address] [place] [phone]: [phone]   A total of 35  minutes spent, greater than 50 % of this time was spent counseling and coordination of care.   This note has been created with Surveyor, quantity. Any transcriptional errors are unintentional.

## 2018-01-23 NOTE — Patient Instructions (Signed)
Keep a log of blood pressure readings over the next 6 weeks. Bring your blood pressure monitor and cuff with you to next visit.  Schedule lab appointment and go to: [facility], [place], [place] [address] [phone]    Thank you for choosing Primary Care at [facility] for your medical home!    Pearson Forster was seen by Molli Barrows, FNP today.   Hedwig Morton Balthazar's primary care provider Molli Barrows, FNP  For the best care possible,  you should try to see Molli Barrows, FNP  whenever you come to clinic.   We look forward to seeing you again soon!  If you have any questions about your visit today,  please call us at   Or feel free to reach your provider via [place].

## 2018-01-23 NOTE — Telephone Encounter (Signed)
Neurologist  Consuella Lose, MD. Schedule an appointment as soon as possible for a visit in 3 week(s).   Specialty:  Neurosurgery Contact information: [address] [place] [address] [phone]  Please contact patient to advise her to follow-up with Neurosurgeon regarding post op appointment.  I forgot to provide her with contact information in office.

## 2018-01-23 NOTE — Telephone Encounter (Signed)
Patient came back into office to wait on her ride. Provided Dr. Cleotilde Neer office number.

## 2018-01-24 ENCOUNTER — Telehealth: Payer: Self-pay

## 2018-01-24 ENCOUNTER — Ambulatory Visit: Payer: Medicare Other | Attending: Internal Medicine

## 2018-01-24 DIAGNOSIS — N390 Urinary tract infection, site not specified: Secondary | ICD-10-CM

## 2018-01-24 DIAGNOSIS — I10 Essential (primary) hypertension: Secondary | ICD-10-CM

## 2018-01-24 NOTE — Telephone Encounter (Signed)
Pt came into the office today for lab work. I went over the urine culture results with pt. Pt asked can she give another urine because she feels that it's going to be negative.   Before I answered Dr. Wynetta Emery walked in and spoke with pt

## 2018-01-24 NOTE — Telephone Encounter (Signed)
I spoke with pt today when she came to laboratory for blood draw and UA ordered by NP yesterday.  I informed pt that the UCx done when she was dischg from hosp grew bacterial and that bacteria was resistent to the abx that she was dischg on.  I informed her that I sent a rxn to pharmacy for her to pick up.  Pt states that she wanted to have repeat UCx first because she does not like taking medications and she feels okay.  I advised against delay in starting  abx that organism is sensitive too but pt insist on repeat Ucx first.

## 2018-01-25 LAB — CBC WITH DIFFERENTIAL/PLATELET
BASOS ABS: 0 10*3/uL (ref 0.0–0.2)
Basos: 1 %
EOS (ABSOLUTE): 0.1 10*3/uL (ref 0.0–0.4)
Eos: 2 %
Hematocrit: 41.2 % (ref 34.0–46.6)
Hemoglobin: 13.3 g/dL (ref 11.1–15.9)
IMMATURE GRANS (ABS): 0 10*3/uL (ref 0.0–0.1)
Immature Granulocytes: 1 %
LYMPHS ABS: 2.3 10*3/uL (ref 0.7–3.1)
LYMPHS: 27 %
MCH: 27.6 pg (ref 26.6–33.0)
MCHC: 32.3 g/dL (ref 31.5–35.7)
MCV: 86 fL (ref 79–97)
Monocytes Absolute: 0.7 10*3/uL (ref 0.1–0.9)
Monocytes: 8 %
NEUTROS ABS: 5.6 10*3/uL (ref 1.4–7.0)
Neutrophils: 61 %
PLATELETS: 231 10*3/uL (ref 150–450)
RBC: 4.82 x10E6/uL (ref 3.77–5.28)
RDW: 13.3 % (ref 12.3–15.4)
WBC: 8.8 10*3/uL (ref 3.4–10.8)

## 2018-01-25 LAB — COMPREHENSIVE METABOLIC PANEL
ALK PHOS: 104 IU/L (ref 39–117)
ALT: 44 IU/L — AB (ref 0–32)
AST: 23 IU/L (ref 0–40)
Albumin/Globulin Ratio: 2 (ref 1.2–2.2)
Albumin: 4.5 g/dL (ref 3.6–4.8)
BILIRUBIN TOTAL: 0.4 mg/dL (ref 0.0–1.2)
BUN/Creatinine Ratio: 9 — ABNORMAL LOW (ref 12–28)
BUN: 8 mg/dL (ref 8–27)
CHLORIDE: 100 mmol/L (ref 96–106)
CO2: 23 mmol/L (ref 20–29)
Calcium: 9.1 mg/dL (ref 8.7–10.3)
Creatinine, Ser: 0.89 mg/dL (ref 0.57–1.00)
GFR calc Af Amer: 78 mL/min/{1.73_m2} (ref >59)
GFR calc non Af Amer: 68 mL/min/{1.73_m2} (ref >59)
Globulin, Total: 2.3 g/dL (ref 1.5–4.5)
Glucose: 92 mg/dL (ref 65–99)
Potassium: 4.2 mmol/L (ref 3.5–5.2)
Sodium: 140 mmol/L (ref 134–144)
Total Protein: 6.8 g/dL (ref 6.0–8.5)

## 2018-01-25 LAB — HEMOGLOBIN A1C
ESTIMATED AVERAGE GLUCOSE: 117 mg/dL
HEMOGLOBIN A1C: 5.7 % — AB (ref 4.8–5.6)

## 2018-01-26 LAB — URINE CULTURE

## 2018-01-26 NOTE — Progress Notes (Signed)
Patient notified of results. Expressed understanding.

## 2018-01-27 ENCOUNTER — Encounter: Payer: Self-pay | Admitting: Cardiology

## 2018-01-31 ENCOUNTER — Telehealth: Payer: Self-pay | Admitting: Cardiology

## 2018-01-31 NOTE — Telephone Encounter (Signed)
  Spoke with Angelica Harvey regarding scheduling her ETT. Pt would like to wait until her BP levels go down some. She will call us when she is ready to set up appointment.

## 2018-02-16 ENCOUNTER — Ambulatory Visit: Payer: Medicare Other | Admitting: Family Medicine

## 2018-04-17 ENCOUNTER — Ambulatory Visit: Payer: Medicare Other | Admitting: Internal Medicine
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
## 2021-03-17 ENCOUNTER — Encounter: Payer: Self-pay | Admitting: Internal Medicine

## 2023-01-01 ENCOUNTER — Ambulatory Visit: Payer: 59

## 2023-01-01 ENCOUNTER — Encounter: Payer: Self-pay | Admitting: *Deleted

## 2023-01-01 ENCOUNTER — Ambulatory Visit
Admission: EM | Admit: 2023-01-01 | Discharge: 2023-01-01 | Disposition: A | Discharge status: Home / Self Care | Payer: 59

## 2023-01-01 DIAGNOSIS — R053 Chronic cough: Secondary | ICD-10-CM

## 2023-01-01 DIAGNOSIS — R059 Cough, unspecified: Secondary | ICD-10-CM

## 2023-01-01 DIAGNOSIS — R042 Hemoptysis: Secondary | ICD-10-CM

## 2023-01-01 MED ORDER — BENZONATATE 100 MG PO CAPS: 100.0000 mg | ORAL_CAPSULE | Freq: Three times a day (TID) | ORAL | 0 refills | Status: AC | PRN

## 2023-01-01 NOTE — ED Triage Notes (Signed)
Pt reports fatigue x 1 week, with cough, low grade fever. Yesterday she  "Coughed up blood" right after waking up. Describes as BRB, small amount- only one episode

## 2023-01-01 NOTE — ED Provider Notes (Addendum)
[facility]    CSN: [id number] Arrival date & time: 01/01/23  1022      History   Chief Complaint Chief Complaint  Patient presents with   Cough    HPI Angelica Harvey is a 72 y.o. female.   Patient presents with cough and fatigue that started about a week ago.  Reports low-grade fever with Tmax of 99 at home.  Denies any upper respiratory symptoms or known sick contacts.  Reports that she coughed up bright red blood in small amounts yesterday which made her more concerned.  Denies history of asthma or COPD and patient has never smoked cigarettes.  She does not report any medications for symptoms.  Patient also has heart rate in the 40s but reports this is baseline for her and she has been told by her cardiologist that she has bradycardia.  She also takes nebivolol.  Denies chest pain, shortness of breath, dizziness, headache, nausea, vomiting.   Cough   Past Medical History:  Diagnosis Date   Anxiety    Cataract    surgery - bilateral   Colon polyps    Depression    History of hypokalemia    Hypercholesterolemia    diet controlled - no meds   Hypertension    Obesity    Pneumonia     Patient Active Problem List   Diagnosis Date Noted   Dyspnea 01/19/2018   Acute lower UTI 01/19/2018   Chest pain 01/18/2018   Status post craniotomy 12/02/2017   Meningioma (HCC) 12/02/2017   Plantar fasciitis of left foot 12/01/2015   Insomnia 06/26/2015   Severe obesity (BMI >= 40) (HCC) 01/23/2015   GERD (gastroesophageal reflux disease) 08/22/2013   Depression 06/20/2013   Dyslipidemia 09/19/2012   Essential hypertension 07/22/2008    Past Surgical History:  Procedure Laterality Date   APPLICATION OF CRANIAL NAVIGATION N/A 12/02/2017   Procedure: APPLICATION OF CRANIAL NAVIGATION;  Surgeon: Lisbeth Renshaw, MD;  Location: [facility] OR;  Service: Neurosurgery;  Laterality: N/A;   cataract surgery Bilateral    CESAREAN SECTION     x 1   COLONOSCOPY  08/2005    Brodie- hx polyps   CRANIOTOMY Right 12/02/2017   Procedure: STEREOTACTIC RIGHT PTERIONAL CRANIOTOMY FOR RESECTION OF MENINGIOMA;  Surgeon: Lisbeth Renshaw, MD;  Location: [facility] OR;  Service: Neurosurgery;  Laterality: Right;   EYE SURGERY Bilateral    Cataract surgery     OB History     Gravida  2   Para  1   Term      Preterm      AB  1   Living  1      SAB      IAB  1   Ectopic      Multiple      Live Births               Home Medications    Prior to Admission medications   Medication Sig Start Date End Date Taking? Authorizing Provider  amLODipine (NORVASC) 10 MG tablet Take 10 mg by mouth daily. 11/08/22  Yes [provider]  benzonatate (TESSALON) 100 MG capsule Take 1 capsule (100 mg total) by mouth every 8 (eight) hours as needed for cough. 01/01/23  Yes Elianah Karis, Acie Fredrickson, FNP  losartan (COZAAR) 100 MG tablet Take 1 tablet (100 mg total) by mouth daily. 11/18/17  Yes Marcine Matar, MD  Nebivolol HCl 20 MG TABS Take 1 tablet by mouth daily. 11/08/22  Yes [provider]  ciprofloxacin (CIPRO) 500 MG tablet Take 1 tablet (500 mg total) by mouth 2 (two) times daily. 01/23/18   Marcine Matar, MD  Multiple Vitamins-Minerals (CENTRUM SILVER 50+WOMEN) TABS Take 1 tablet by mouth daily.    [provider]  Probiotic Product (PROBIOTIC PO) Take 1 capsule by mouth daily.     [provider]    Family History Family History  Problem Relation Age of Onset   Heart attack Father    Heart disease Father        CABG   Colon polyps Mother    Kidney disease Sister        dialysis   Colon cancer Maternal Aunt        x 2   Esophageal cancer Neg Hx    Stomach cancer Neg Hx    Rectal cancer Neg Hx     Social History Social History   Tobacco Use   Smoking status: Former    Current packs/day: 0.00    Average packs/day: 1 pack/day for 17.0 years (17.0 ttl pk-yrs)    Types: Cigarettes    Start date: 04/12/1973    Quit date:  04/12/1990    Years since quitting: 32.7   Smokeless tobacco: Never  Vaping Use   Vaping status: Never Used  Substance Use Topics   Alcohol use: Not Currently    Comment: ocassional wine    Drug use: No     Allergies   Prozac [fluoxetine], Belladonna, Hctz [hydrochlorothiazide], Keppra [levetiracetam], Phenobarbital, and Quinine   Review of Systems Review of Systems Per HPI  Physical Exam Triage Vital Signs ED Triage Vitals [01/01/23 1104]  Encounter Vitals Group     BP (!) 143/75     Systolic BP Percentile      Diastolic BP Percentile      Pulse Rate (S) (!) 48     Resp 18     Temp 98 F (36.7 C)     Temp Source Oral     SpO2 96 %     Weight      Height      Head Circumference      Peak Flow      Pain Score      Pain Loc      Pain Education      Exclude from Growth Chart    No data found.  Updated Vital Signs BP (!) 143/75 (BP Location: Left Wrist)   Pulse (S) (!) 48 Comment: pt reports hx of bradycardia  Temp 98 F (36.7 C) (Oral)   Resp 18   SpO2 96%   Visual Acuity Right Eye Distance:   Left Eye Distance:   Bilateral Distance:    Right Eye Near:   Left Eye Near:    Bilateral Near:     Physical Exam Constitutional:      General: She is not in acute distress.    Appearance: Normal appearance. She is not toxic-appearing or diaphoretic.  HENT:     Head: Normocephalic and atraumatic.  Eyes:     Extraocular Movements: Extraocular movements intact.     Conjunctiva/sclera: Conjunctivae normal.  Cardiovascular:     Rate and Rhythm: Regular rhythm. Bradycardia present.     Pulses: Normal pulses.     Heart sounds: Normal heart sounds.  Pulmonary:     Effort: Pulmonary effort is normal. No respiratory distress.     Breath sounds: Normal breath sounds. No stridor. No wheezing, rhonchi or  rales.  Neurological:     General: No focal deficit present.     Mental Status: She is alert and oriented to person, place, and time. Mental status is at baseline.   Psychiatric:        Mood and Affect: Mood normal.        Behavior: Behavior normal.        Thought Content: Thought content normal.        Judgment: Judgment normal.      UC Treatments / Results  Labs (all labs ordered are listed, but only abnormal results are displayed) Labs Reviewed - No data to display  EKG   Radiology DG Chest 2 View  Result Date: 01/01/2023 CLINICAL DATA:  Fatigue for 1 week with cough and low-grade fever EXAM: CHEST - 2 VIEW COMPARISON:  Chest radiograph 01/18/2018 FINDINGS: The cardiomediastinal silhouette is within normal limits There is no focal consolidation or pulmonary edema. There is no pleural effusion or pneumothorax There is no acute osseous abnormality. IMPRESSION: No radiographic evidence of acute cardiopulmonary process. Electronically Signed   By: Lesia Hausen M.D.   On: 01/01/2023 11:56    Procedures Procedures (including critical care time)  Medications Ordered in UC Medications - No data to display  Initial Impression / Assessment and Plan / UC Course  I have reviewed the triage vital signs and the nursing notes.  Pertinent labs & imaging results that were available during my care of the patient were reviewed by me and considered in my medical decision making (see chart for details).     Chest x-ray completed that was negative for any acute cardiopulmonary process.  Suspect postviral cough versus viral bronchitis.  Hemoptysis could be simply due to inflammation but patient also reported that she had been eating some strawberry candy so this could be etiology as well.  Prescribed benzonatate to take for cough.  Patient has PCP appointment in a few days so encouraged to keep this for follow-up.  Advised strict ER precautions if symptoms persist or worsen.  Patient's heart rate is in the 40s but patient reports this at baseline.  Although, after further review of the chart, I do not see any previous history of heart rate this low.  Given  patient takes nebivolol this is most likely contributing factor and do not think EKG is necessary given she is asymptomatic regarding heart rate.  Discussed slightly lowering dose of nebivolol today to see if this will be helpful but patient declined this wishing to follow-up with her PCP to have this completed and to discuss this with him. States that her PCP prescribed this and not her cardiologist.  Advised to call them as soon as possible to discuss heart rate.  Patient verbalized understanding and was agreeable with plan. Patient declined covid testing.  Final Clinical Impressions(s) / UC Diagnoses   Final diagnoses:  Hemoptysis  Persistent cough     Discharge Instructions      I have prescribed you a cough medication to take as needed.  Your chest x-ray was normal.  Follow-up with primary care doctor at your scheduled appointment and discuss coughing and decreasing nebivolol dosing given that your heart rate is in the 40s today.     ED Prescriptions     Medication Sig Dispense Auth. Provider   benzonatate (TESSALON) 100 MG capsule Take 1 capsule (100 mg total) by mouth every 8 (eight) hours as needed for cough. 21 capsule [place], Acie Fredrickson, [place]  PDMP not reviewed this encounter.   Gustavus Bryant, [place] 01/01/23 1236    Gustavus Bryant, [place] 01/01/23 [phone]

## 2023-01-01 NOTE — Discharge Instructions (Signed)
I have prescribed you a cough medication to take as needed.  Your chest x-ray was normal.  Follow-up with primary care doctor at your scheduled appointment and discuss coughing and decreasing nebivolol dosing given that your heart rate is in the 40s today.
